# Patient Record
Sex: Female | Born: 1980 | Race: Asian | Hispanic: No | Marital: Married | Smoking: Never smoker
Health system: Southern US, Community
[De-identification: ages and names within clinical notes are randomized; demographics above are authoritative.]

## PROBLEM LIST (undated history)

## (undated) DIAGNOSIS — Z789 Other specified health status: Secondary | ICD-10-CM

## (undated) HISTORY — PX: NO PAST SURGERIES: SHX2092

---

## 2011-04-15 LAB — GC/CHLAMYDIA PROBE AMP, GENITAL
Chlamydia: NEGATIVE
Gonorrhea: NEGATIVE

## 2011-04-15 LAB — ABO/RH

## 2011-04-15 LAB — HIV ANTIBODY (ROUTINE TESTING W REFLEX): HIV: NONREACTIVE

## 2011-04-21 ENCOUNTER — Other Ambulatory Visit (HOSPITAL_COMMUNITY): Payer: Self-pay | Admitting: Obstetrics and Gynecology

## 2011-04-21 ENCOUNTER — Ambulatory Visit (HOSPITAL_COMMUNITY)
Admission: RE | Admit: 2011-04-21 | Discharge: 2011-04-21 | Disposition: A | Discharge status: Home / Self Care | Payer: BC Managed Care – PPO | Source: Ambulatory Visit | Attending: Obstetrics and Gynecology | Admitting: Obstetrics and Gynecology

## 2011-04-21 DIAGNOSIS — O368190 Decreased fetal movements, unspecified trimester, not applicable or unspecified: Secondary | ICD-10-CM

## 2011-04-21 DIAGNOSIS — Z3689 Encounter for other specified antenatal screening: Secondary | ICD-10-CM | POA: Insufficient documentation

## 2011-04-25 LAB — HIV ANTIBODY (ROUTINE TESTING W REFLEX): HIV: NONREACTIVE

## 2011-04-25 LAB — ANTIBODY SCREEN: Antibody Screen: NEGATIVE

## 2011-07-14 NOTE — L&D Delivery Note (Signed)
Pt was 8cm this am when pitocin was to be started. She was having variable and occ. Late decels.  AROM was performed at 10cm. Moderate meconium was noted. She pushed for approximately 20 min. The VE was placed at +3 station to shorten the second stage. She delivered one live viable female, apgars 8 &9. NICU present. Baby to NBN. Placenta to path. Second degree tear closed with 3-0 chromic. Baby delivered ROA.

## 2011-07-15 LAB — STREP B DNA PROBE: GBS: NEGATIVE

## 2011-07-24 ENCOUNTER — Encounter (HOSPITAL_COMMUNITY): Payer: Self-pay | Admitting: *Deleted

## 2011-07-24 ENCOUNTER — Inpatient Hospital Stay (HOSPITAL_COMMUNITY)
Admission: AD | Admit: 2011-07-24 | Discharge: 2011-07-27 | DRG: 373 | Disposition: A | Discharge status: Home / Self Care | Payer: BC Managed Care – PPO | Source: Ambulatory Visit | Attending: Obstetrics and Gynecology | Admitting: Obstetrics and Gynecology

## 2011-07-24 DIAGNOSIS — O365990 Maternal care for other known or suspected poor fetal growth, unspecified trimester, not applicable or unspecified: Principal | ICD-10-CM | POA: Diagnosis present

## 2011-07-24 DIAGNOSIS — Z3480 Encounter for supervision of other normal pregnancy, unspecified trimester: Secondary | ICD-10-CM

## 2011-07-24 HISTORY — DX: Other specified health status: Z78.9

## 2011-07-24 LAB — CBC
Platelets: 220 10*3/uL (ref 150–400)
RDW: 14.5 % (ref 11.5–15.5)
WBC: 9.6 10*3/uL (ref 4.0–10.5)

## 2011-07-24 MED ORDER — LIDOCAINE HCL (PF) 1 % IJ SOLN
30.0000 mL | INTRAMUSCULAR | Status: DC | PRN
  Administered 2011-07-25: 30 mL via SUBCUTANEOUS
  Filled 2011-07-24: qty 30

## 2011-07-24 MED ORDER — OXYCODONE-ACETAMINOPHEN 5-325 MG PO TABS: 2.0000 | ORAL_TABLET | ORAL | Status: DC | PRN

## 2011-07-24 MED ORDER — MISOPROSTOL 25 MCG QUARTER TABLET
25.0000 ug | ORAL_TABLET | ORAL | Status: DC | PRN
  Administered 2011-07-24 – 2011-07-25 (×2): 25 ug via VAGINAL
  Filled 2011-07-24 (×2): qty 0.25

## 2011-07-24 MED ORDER — OXYTOCIN 20 UNITS IN LACTATED RINGERS INFUSION - SIMPLE
1.0000 m[IU]/min | INTRAVENOUS | Status: DC
Start: 2011-07-25 — End: 2011-07-25

## 2011-07-24 MED ORDER — LACTATED RINGERS IV SOLN
500.0000 mL | INTRAVENOUS | Status: DC | PRN
  Administered 2011-07-25: 300 mL via INTRAVENOUS

## 2011-07-24 MED ORDER — ACETAMINOPHEN 325 MG PO TABS: 650.0000 mg | ORAL_TABLET | ORAL | Status: DC | PRN

## 2011-07-24 MED ORDER — FLEET ENEMA 7-19 GM/118ML RE ENEM: 1.0000 | ENEMA | RECTAL | Status: DC | PRN

## 2011-07-24 MED ORDER — CITRIC ACID-SODIUM CITRATE 334-500 MG/5ML PO SOLN: 30.0000 mL | ORAL | Status: DC | PRN

## 2011-07-24 MED ORDER — LACTATED RINGERS IV SOLN
INTRAVENOUS | Status: DC
  Administered 2011-07-24: 23:00:00 via INTRAVENOUS

## 2011-07-24 MED ORDER — ONDANSETRON HCL 4 MG/2ML IJ SOLN: 4.0000 mg | Freq: Four times a day (QID) | INTRAMUSCULAR | Status: DC | PRN

## 2011-07-24 MED ORDER — IBUPROFEN 600 MG PO TABS: 600.0000 mg | ORAL_TABLET | Freq: Four times a day (QID) | ORAL | Status: DC | PRN

## 2011-07-24 MED ORDER — TERBUTALINE SULFATE 1 MG/ML IJ SOLN: 0.2500 mg | Freq: Once | INTRAMUSCULAR | Status: AC | PRN

## 2011-07-24 MED ORDER — OXYTOCIN 20 UNITS IN LACTATED RINGERS INFUSION - SIMPLE
125.0000 mL/h | Freq: Once | INTRAVENOUS | Status: AC
  Administered 2011-07-25: 999 mL/h via INTRAVENOUS

## 2011-07-24 MED ORDER — OXYTOCIN BOLUS FROM INFUSION
500.0000 mL | Freq: Once | INTRAVENOUS | Status: DC
  Filled 2011-07-24: qty 500
  Filled 2011-07-24: qty 1000

## 2011-07-25 ENCOUNTER — Inpatient Hospital Stay (HOSPITAL_COMMUNITY): Payer: BC Managed Care – PPO | Admitting: Anesthesiology

## 2011-07-25 ENCOUNTER — Encounter (HOSPITAL_COMMUNITY): Payer: Self-pay | Admitting: *Deleted

## 2011-07-25 ENCOUNTER — Other Ambulatory Visit: Payer: Self-pay | Admitting: Obstetrics and Gynecology

## 2011-07-25 ENCOUNTER — Encounter (HOSPITAL_COMMUNITY): Payer: Self-pay | Admitting: Anesthesiology

## 2011-07-25 LAB — COMPREHENSIVE METABOLIC PANEL
ALT: 9 U/L (ref 0–35)
CO2: 18 mEq/L — ABNORMAL LOW (ref 19–32)
Calcium: 8.9 mg/dL (ref 8.4–10.5)
Chloride: 105 mEq/L (ref 96–112)
Creatinine, Ser: 0.59 mg/dL (ref 0.50–1.10)
GFR calc Af Amer: >90 mL/min (ref >90)
GFR calc non Af Amer: >90 mL/min (ref >90)
Glucose, Bld: 74 mg/dL (ref 70–99)
Sodium: 134 mEq/L — ABNORMAL LOW (ref 135–145)
Total Bilirubin: 0.2 mg/dL — ABNORMAL LOW (ref 0.3–1.2)

## 2011-07-25 LAB — CBC
HCT: 36.9 % (ref 36.0–46.0)
MCV: 82.6 fL (ref 78.0–100.0)
Platelets: 222 10*3/uL (ref 150–400)
RBC: 4.47 MIL/uL (ref 3.87–5.11)
WBC: 10.8 10*3/uL — ABNORMAL HIGH (ref 4.0–10.5)

## 2011-07-25 MED ORDER — HYDRALAZINE HCL 20 MG/ML IJ SOLN
10.0000 mg | Freq: Once | INTRAMUSCULAR | Status: AC
  Administered 2011-07-25: 10 mg via INTRAVENOUS
  Filled 2011-07-25: qty 1

## 2011-07-25 MED ORDER — EPHEDRINE 5 MG/ML INJ
10.0000 mg | INTRAVENOUS | Status: DC | PRN
  Filled 2011-07-25: qty 4

## 2011-07-25 MED ORDER — WITCH HAZEL-GLYCERIN EX PADS: 1.0000 "application " | MEDICATED_PAD | CUTANEOUS | Status: DC | PRN

## 2011-07-25 MED ORDER — BENZOCAINE-MENTHOL 20-0.5 % EX AERO
1.0000 "application " | INHALATION_SPRAY | CUTANEOUS | Status: DC | PRN
  Administered 2011-07-25: 1 via TOPICAL

## 2011-07-25 MED ORDER — MEASLES, MUMPS & RUBELLA VAC ~~LOC~~ INJ: 0.5000 mL | INJECTION | Freq: Once | SUBCUTANEOUS | Status: DC

## 2011-07-25 MED ORDER — OXYCODONE-ACETAMINOPHEN 5-325 MG PO TABS: 1.0000 | ORAL_TABLET | ORAL | Status: DC | PRN

## 2011-07-25 MED ORDER — DIPHENHYDRAMINE HCL 50 MG/ML IJ SOLN: 12.5000 mg | INTRAMUSCULAR | Status: DC | PRN

## 2011-07-25 MED ORDER — SIMETHICONE 80 MG PO CHEW: 80.0000 mg | CHEWABLE_TABLET | ORAL | Status: DC | PRN

## 2011-07-25 MED ORDER — ONDANSETRON HCL 4 MG PO TABS: 4.0000 mg | ORAL_TABLET | ORAL | Status: DC | PRN

## 2011-07-25 MED ORDER — DIBUCAINE 1 % RE OINT: 1.0000 "application " | TOPICAL_OINTMENT | RECTAL | Status: DC | PRN

## 2011-07-25 MED ORDER — PHENYLEPHRINE 40 MCG/ML (10ML) SYRINGE FOR IV PUSH (FOR BLOOD PRESSURE SUPPORT)
80.0000 ug | PREFILLED_SYRINGE | INTRAVENOUS | Status: DC | PRN
  Filled 2011-07-25: qty 5

## 2011-07-25 MED ORDER — FENTANYL 2.5 MCG/ML BUPIVACAINE 1/10 % EPIDURAL INFUSION (WH - ANES)
14.0000 mL/h | INTRAMUSCULAR | Status: DC
  Administered 2011-07-25: 14 mL/h via EPIDURAL
  Filled 2011-07-25: qty 60

## 2011-07-25 MED ORDER — TETANUS-DIPHTH-ACELL PERTUSSIS 5-2.5-18.5 LF-MCG/0.5 IM SUSP: 0.5000 mL | Freq: Once | INTRAMUSCULAR | Status: DC

## 2011-07-25 MED ORDER — LACTATED RINGERS IV SOLN
500.0000 mL | Freq: Once | INTRAVENOUS | Status: AC
  Administered 2011-07-25: 1000 mL via INTRAVENOUS

## 2011-07-25 MED ORDER — IBUPROFEN 600 MG PO TABS
600.0000 mg | ORAL_TABLET | Freq: Four times a day (QID) | ORAL | Status: DC
  Administered 2011-07-25 – 2011-07-27 (×7): 600 mg via ORAL
  Filled 2011-07-25 (×7): qty 1

## 2011-07-25 MED ORDER — EPHEDRINE 5 MG/ML INJ: 10.0000 mg | INTRAVENOUS | Status: DC | PRN

## 2011-07-25 MED ORDER — INFLUENZA VIRUS VACC SPLIT PF IM SUSP
0.5000 mL | INTRAMUSCULAR | Status: AC
  Filled 2011-07-25: qty 0.5

## 2011-07-25 MED ORDER — ONDANSETRON HCL 4 MG/2ML IJ SOLN: 4.0000 mg | INTRAMUSCULAR | Status: DC | PRN

## 2011-07-25 MED ORDER — BENZOCAINE-MENTHOL 20-0.5 % EX AERO
INHALATION_SPRAY | CUTANEOUS | Status: AC
  Administered 2011-07-25: 1 via TOPICAL
  Filled 2011-07-25: qty 56

## 2011-07-25 MED ORDER — ZOLPIDEM TARTRATE 5 MG PO TABS: 5.0000 mg | ORAL_TABLET | Freq: Every evening | ORAL | Status: DC | PRN

## 2011-07-25 MED ORDER — LIDOCAINE HCL 1.5 % IJ SOLN
INTRAMUSCULAR | Status: DC | PRN
  Administered 2011-07-25 (×2): 5 mL via EPIDURAL

## 2011-07-25 MED ORDER — PHENYLEPHRINE 40 MCG/ML (10ML) SYRINGE FOR IV PUSH (FOR BLOOD PRESSURE SUPPORT): 80.0000 ug | PREFILLED_SYRINGE | INTRAVENOUS | Status: DC | PRN

## 2011-07-25 NOTE — Anesthesia Procedure Notes (Signed)
Epidural Patient location during procedure: OB Start time: 07/25/2011 9:40 AM  Staffing Anesthesiologist: Brayton Caves R Performed by: anesthesiologist   Preanesthetic Checklist Completed: patient identified, site marked, surgical consent, pre-op evaluation, timeout performed, IV checked, risks and benefits discussed and monitors and equipment checked  Epidural Patient position: sitting Prep: site prepped and draped and DuraPrep Patient monitoring: continuous pulse ox and blood pressure Approach: midline Injection technique: LOR air and LOR saline  Needle:  Needle type: Tuohy  Needle gauge: 17 G Needle length: 9 cm Needle insertion depth: 5 cm cm Catheter type: closed end flexible Catheter size: 19 Gauge Catheter at skin depth: 10 cm Test dose: negative  Assessment Events: blood not aspirated, injection not painful, no injection resistance, negative IV test and no paresthesia  Additional Notes Patient identified.  Risk benefits discussed including failed block, incomplete pain control, headache, nerve damage, paralysis, blood pressure changes, nausea, vomiting, reactions to medication both toxic or allergic, and postpartum back pain.  Patient expressed understanding and wished to proceed.  All questions were answered.  Sterile technique used throughout procedure and epidural site dressed with sterile barrier dressing. No paresthesia or other complications noted.The patient did not experience any signs of intravascular injection such as tinnitus or metallic taste in mouth nor signs of intrathecal spread such as rapid motor block. Please see nursing notes for vital signs.

## 2011-07-25 NOTE — Progress Notes (Signed)
PIH labs all wnl 

## 2011-07-25 NOTE — H&P (Signed)
Pt is a 31 year old [place] women G1P0 at 37 weeks who is admitted for induction secondary to IUGR and abnormal dopplers.  Pt will be given Cytotec and then pit started in am.  AFI is normal. BPP is 8/8 PMHX: see [place] PE: B/P elevated, question if anxiety, has not been elevated during preg.        HEENT-wnl        Abd- gravid, no masses, no ctxs.        Cx- 50/1/-2 vtx  FHTs- reactive.  IMP/ stable PLAN/ admit

## 2011-07-25 NOTE — Anesthesia Preprocedure Evaluation (Signed)
Anesthesia Evaluation  Patient identified by MRN, date of birth, ID band Patient awake    Reviewed: Allergy & Precautions, H&P , NPO status , Patient's Chart, lab work & pertinent test results  Airway Mallampati: II TM Distance: >3 FB Neck ROM: full    Dental No notable dental hx.    Pulmonary neg pulmonary ROS, asthma ,  clear to auscultation  Pulmonary exam normal       Cardiovascular Exercise Tolerance: Good hypertension, neg cardio ROS regular Normal    Neuro/Psych Negative Neurological ROS  Negative Psych ROS   GI/Hepatic negative GI ROS, Neg liver ROS,   Endo/Other  Negative Endocrine ROS  Renal/GU negative Renal ROS  Genitourinary negative   Musculoskeletal   Abdominal Normal abdominal exam  (+)   Peds  Hematology negative hematology ROS (+)   Anesthesia Other Findings   Reproductive/Obstetrics (+) Pregnancy                           Anesthesia Physical Anesthesia Plan  ASA: II  Anesthesia Plan: Epidural   Post-op Pain Management:    Induction:   Airway Management Planned:   Additional Equipment:   Intra-op Plan:   Post-operative Plan:   Informed Consent: I have reviewed the patients History and Physical, chart, labs and discussed the procedure including the risks, benefits and alternatives for the proposed anesthesia with the patient or authorized representative who has indicated his/her understanding and acceptance.     Plan Discussed with: Anesthesiologist, CRNA and Surgeon  Anesthesia Plan Comments:         Anesthesia Quick Evaluation

## 2011-07-25 NOTE — Consult Note (Signed)
Called urgently to attend vaginal delivery of term growth restricted infant with report of meconium stained fluid. ARrived prior to delivery after which infant placed under radiant warmer. Infant had cried at time of birth and had active tone prior to being placed under radiant warmer.  Given vigorous tactile stimulation with drying and bulb suction to naso/oropharynx yielding clear mucus. Laryngoscopy deferred based on spontaneous cries and infant not being moribund.   Given Apgar of 8/9 at 1/5 minutes with no dysmorphic features. Infant's care assigned to RN in room and to assigned pediatrician with advice given to not allow open exposure to environment to avoid hypothermia and to limit time back in mother's room, rather transferring to Transitional Nursery after 15-20 minutes so further transition can be monitored more closely.  Kim Ligas MD [facility]

## 2011-07-25 NOTE — Anesthesia Postprocedure Evaluation (Signed)
  Anesthesia Post-op Note  Patient: Kim Bryan  Procedure(s) Performed: * No procedures listed *  Patient Location: PACU and Mother/Baby  Anesthesia Type: Epidural  Level of Consciousness: awake, alert  and oriented  Airway and Oxygen Therapy: Patient Spontanous Breathing   Post-op Assessment: Patient's Cardiovascular Status Stable and Respiratory Function Stable  Post-op Vital Signs: stable  Complications: No apparent anesthesia complications

## 2011-07-26 LAB — CBC
HCT: 31.5 % — ABNORMAL LOW (ref 36.0–46.0)
Hemoglobin: 10.5 g/dL — ABNORMAL LOW (ref 12.0–15.0)
MCHC: 33.3 g/dL (ref 30.0–36.0)

## 2011-07-26 NOTE — Progress Notes (Signed)
PPD#1 Pt without c/o. Lochia-mild Baby doing well. VSSAF Imp/doing well Plan/ routine care.

## 2011-07-27 ENCOUNTER — Encounter (HOSPITAL_COMMUNITY)
Admission: RE | Admit: 2011-07-27 | Discharge: 2011-07-27 | Disposition: A | Discharge status: Home / Self Care | Payer: BC Managed Care – PPO | Source: Ambulatory Visit | Attending: Obstetrics and Gynecology | Admitting: Obstetrics and Gynecology

## 2011-07-27 DIAGNOSIS — O923 Agalactia: Secondary | ICD-10-CM | POA: Insufficient documentation

## 2011-07-27 NOTE — Discharge Summary (Signed)
Discharge diagnoses-  #1-37 week intrauterine pregnancy delivered 4 pound 4.8 ounce female infant Apgars 8 and 9  #2-blood type A-positive  #3-induction for IUGR  Procedures-  #1-induction of labor  #2-vacuum extraction assisted delivery  #3-second-degree tear and repair  Summary-this 31 year old gravida 1 now para 1 was admitted at [redacted] weeks gestation-(EDC 08/14/11)-because of suspected IUGR-- for induction.    Induction went well and on the morning of 1/12 she underwent a vacuum extraction assisted delivery because of second stage fetal decelerations with delivery of a 4 pound 4.8 ounce female infant with Apgars of 8 and 9. A second-degree tear was repaired without difficulty. The mother's postpartum course was totally benign. Her CBC on 1/13 was 10.5/11.4 with  171,000 platelets.  On the morning of 1/14 she was ambulating well, breast feeding well, vital signs were stable and she was desirous of discharge. Accordingly she was given all of her instructions for discharge brochure and understood all instructions well. Discharge medications include vitamins-1 and as long as she is breast-feeding and she will also use Feosol capsules or the equivalent thereof one 3-4 times a week. In addition she was given a prescription for Motrin 600 mg to use every 6 hours for cramping or pain. She will return the office in followup in approximately 4 weeks time or as needed.

## 2011-07-29 ENCOUNTER — Inpatient Hospital Stay (HOSPITAL_COMMUNITY): Admission: RE | Admit: 2011-07-29 | Discharge: 2011-07-29 | Payer: BC Managed Care – PPO | Source: Ambulatory Visit

## 2011-07-29 NOTE — Progress Notes (Signed)
Adult Lactation Consultation Outpatient Visit Note  Patient Name: Kim Bryan Date of Birth: 11/10/1980 Gestational Age at Delivery: [redacted]w[redacted]d Type of Delivery: NSVD  Breastfeeding History: Frequency of Breastfeeding: ATTEMPTS ONLY Length of Feeding:  Voids: 5-6  Stools: 3+  Supplementing / Method: BOTTLE EBM/FORMULA  30 MLS EVERY 3 HOURS Pumping:  Type of Pump:SYMPHONY   Frequency:EVERY 3 HOURS  Volume:  20 MLS  LEFT BREAST   FEW MLS RIGHT BREAST  Comments: Baby 69 days old/pedi appointment tomorrow    Consultation Evaluation:  Patient here with c/o engorgement since yesterday.  Mother did not bring baby due to baby is small and not latching yet.  Another [facility] consult for feeding assessment is scheduled for Monday 08/03/11.  Both breasts engorged with the right side more severe.  Warm compresses and massage done 5 minutes prior to pumping.  Immediate good flow from left breast but slow flow from right side .  Right breast massaged throughout pumping with improved flow.  Ice pack then applied to right side.  10 mls obtained from right and 28 mls from left.  Patient instructed to ice every 2 hours for 20 min, heat, massage and pump every 2 hours.  Husband will assist with breast massage.  Initial Feeding Assessment: Pre-feed Weight: Post-feed Weight: Amount Transferred: Comments:  Additional Feeding Assessment: Pre-feed Weight: Post-feed Weight: Amount Transferred: Comments:  Additional Feeding Assessment: Pre-feed Weight: Post-feed Weight: Amount Transferred: Comments:  Total Breast milk Transferred this Visit:  Total Supplement Given:   Additional Interventions:   Follow-Up  Lactation appointment 08/03/11      Hansel Feinstein 07/29/2011, 4:46 PM

## 2011-08-03 ENCOUNTER — Ambulatory Visit (HOSPITAL_COMMUNITY)
Admit: 2011-08-03 | Discharge: 2011-08-03 | Disposition: A | Discharge status: Home / Self Care | Payer: BC Managed Care – PPO | Attending: Pediatrics | Admitting: Pediatrics

## 2011-08-03 NOTE — Progress Notes (Signed)
Adult Lactation Consultation Outpatient Visit Note  Patient Name: Kim Bryan Date of Birth: [birth year] Gestational Age at Delivery: [redacted]w[redacted]d Type of Delivery:   Breastfeeding History: Frequency of Breastfeeding: Has been mostly pumping and bottle feeding EBM plus formula Length of Feeding:  Voids:  Stools:   Supplementing / Method:Bottle Pumping:  Type of Pump:[employer]   Frequency: q 3-4 hours  Volume:    Comments:    Consultation Evaluation:  Initial Feeding Assessment: Pre-feed Weight:4 lbs 11.3 oz  2136g Post-feed Weight: Amount Transferred: Comments:  Additional Feeding Assessment: Pre-feed Weight: Post-feed Weight: Amount Transferred: Comments:  Additional Feeding Assessment: Pre-feed Weight: Post-feed Weight: Amount Transferred: Comments:  Total Breast milk Transferred this Visit:  Total Supplement Given: 55cc EBM by bottle  Additional Interventions: Mom states baby if very fussy at the breast. Reports she started making more milk in the last few days. Is able to express some for baby to taste. Baby latched but was on and off breast and fussy. Bottlefed 10 cc EBM then attempt at breast again. Baby would suck for a few minutes then off. Mom reports that this is much better that baby has been doing. Mom needed much assistance with having the baby at the breast.Mom would hold breast and Dad or myself held the baby to the breast. Baby was on and off both breasts for about 15 minutes, then became fussy. Mom was pleased with how baby had done so we bottle fed rest of EBM. Reviewed engorgement treatment and importance of pumping if baby is not emptying the breast. No questions at present,.Wants to go home and work on BF and will make another appointment if needed.  Follow-Up  Ped on Thursday To call here prn    Pamelia Hoit 08/03/2011, 2:03 PM

## 2011-08-27 ENCOUNTER — Encounter (HOSPITAL_COMMUNITY): Payer: BC Managed Care – PPO | Attending: Obstetrics and Gynecology

## 2011-08-27 DIAGNOSIS — O923 Agalactia: Secondary | ICD-10-CM | POA: Insufficient documentation

## 2011-09-03 ENCOUNTER — Other Ambulatory Visit: Payer: Self-pay | Admitting: Obstetrics and Gynecology

## 2012-09-27 IMAGING — US US FETAL BPP W/O NONSTRESS
1 series · 11 of 11 positions shown · non-contrast
Comparison: none

[Series 1: us fetal bpp w/o nonstress · non-contrast · 11 acquisitions, 11 frames shown]
[im 1/11]
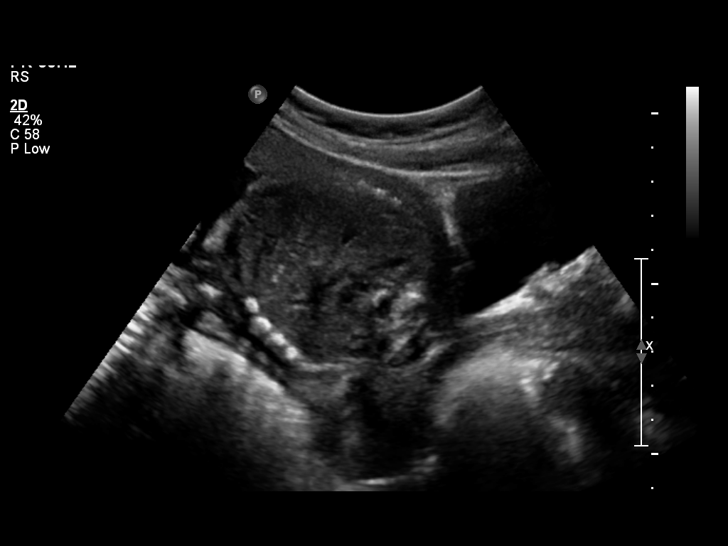
[im 2/11]
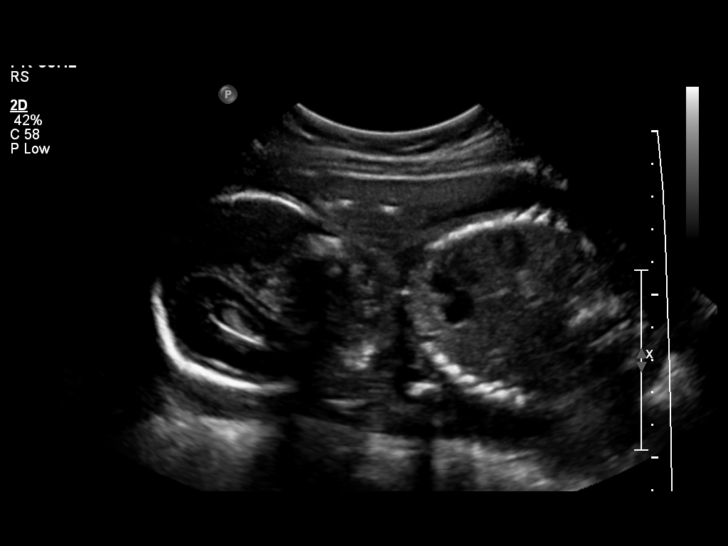
[im 3/11]
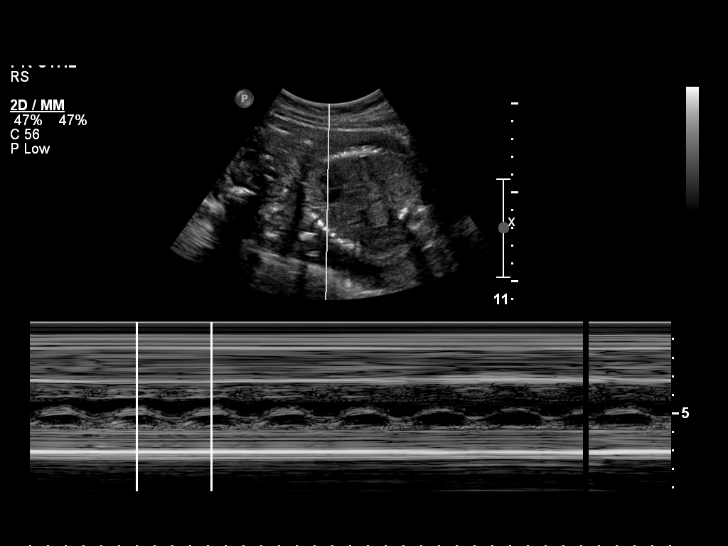
[im 4/11]
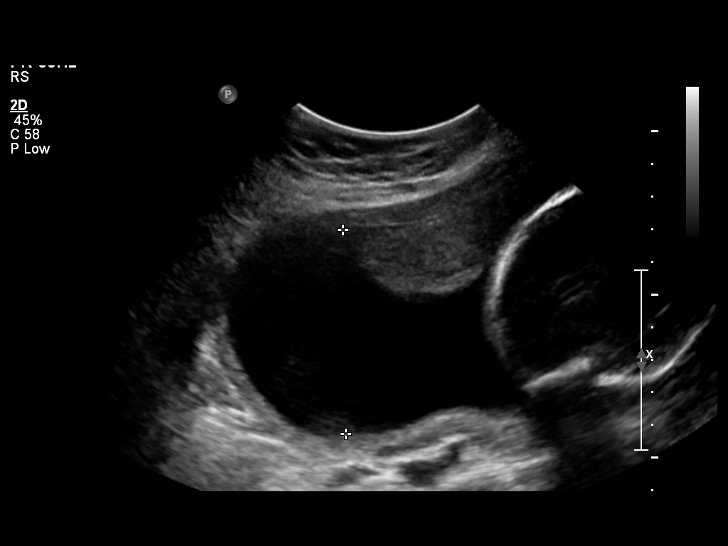
[im 5/11]
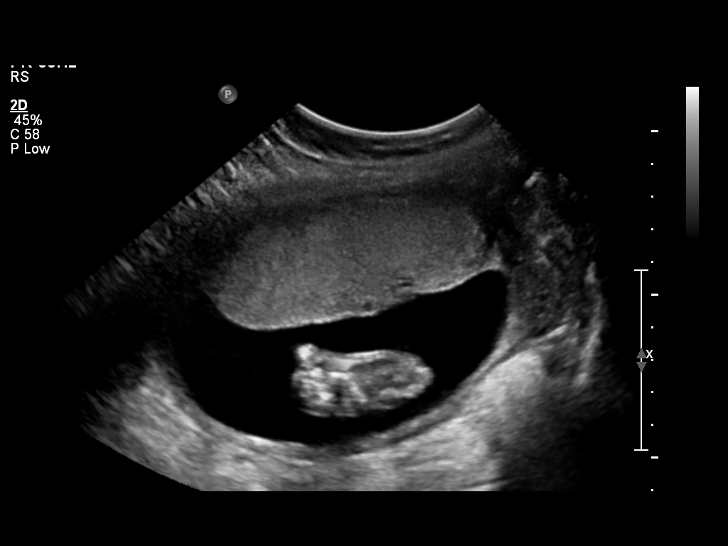
[im 6/11]
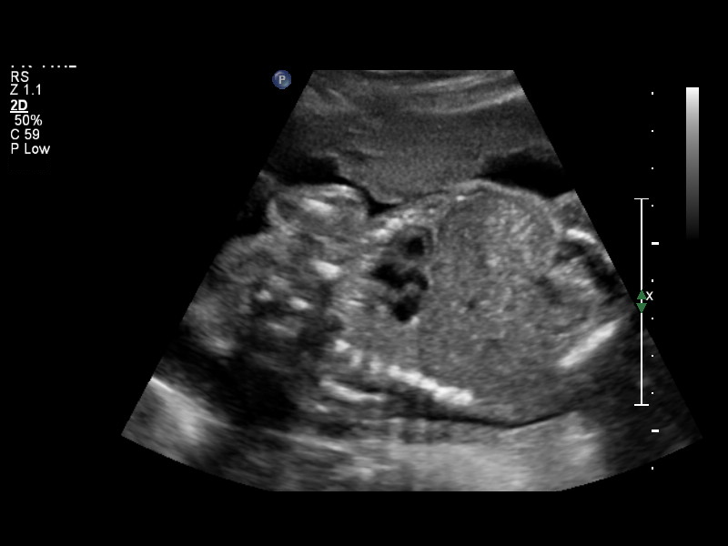
[im 7/11]
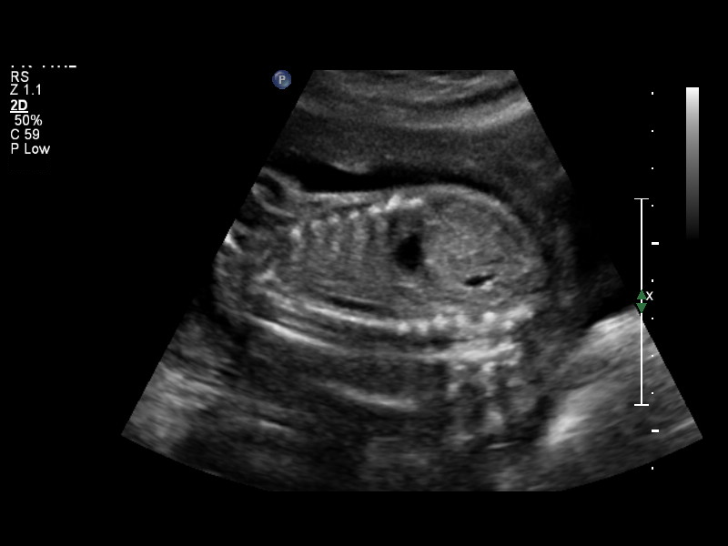
[im 8/11]
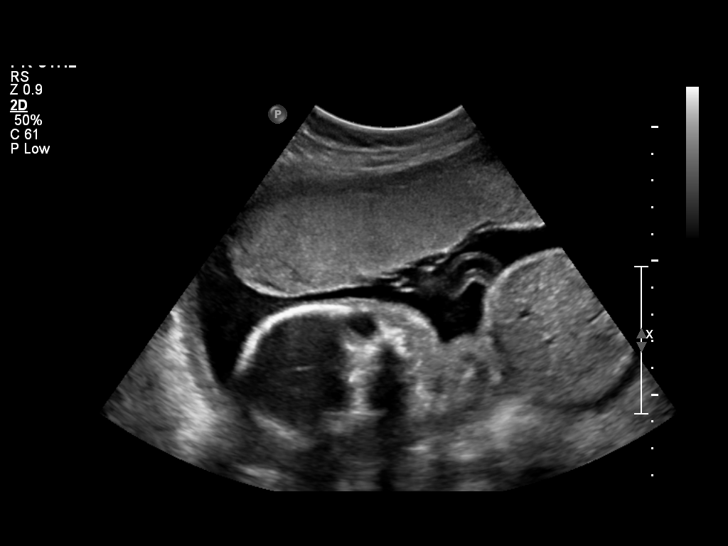
[im 9/11]
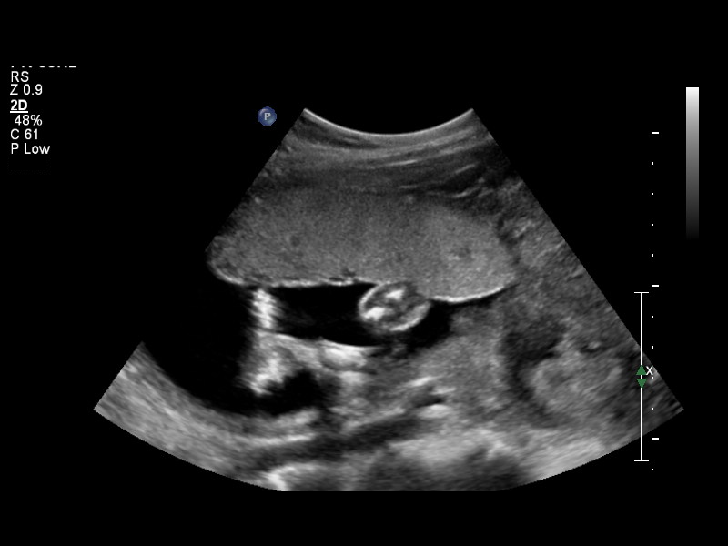
[im 10/11]
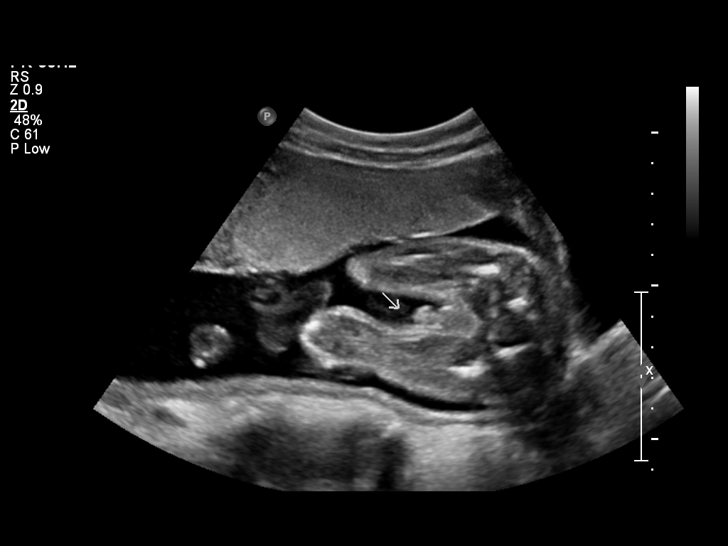
[im 11/11]
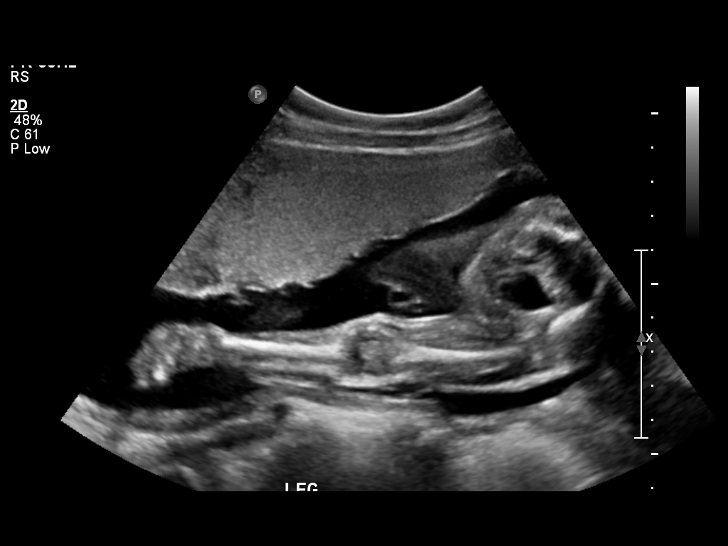

[11 of 11 positions shown; findings below may reference images not displayed]

OBSTETRICS REPORT
                      (Signed Final 04/21/2011 [DATE])

 Order#:         [id number]
Procedures

Indications

 Decreased fetal movement
Fetal Evaluation

 Fetal Heart Rate:  143                          bpm
 Cardiac Activity:  Observed
 Presentation:      Breech

 Comment:    BPP time = 24 minutes.

 Amniotic Fluid
 AFI FV:      Subjectively within normal limits
                                             Larg Pckt:     6.2  cm
Biophysical Evaluation

 Amniotic F.V:   Pocket => 2 cm two         F. Tone:        Observed
                 planes
 F. Movement:    Observed                   Score:          [DATE]
 F. Breathing:   Observed
Impression

 [DATE] BPP.

 Thank you for sharing in the care of Ms. MAIKEL GUIDRY with us.
 questions or concerns.

## 2014-04-09 LAB — OB RESULTS CONSOLE RPR: RPR: NONREACTIVE

## 2014-04-09 LAB — OB RESULTS CONSOLE ANTIBODY SCREEN: ANTIBODY SCREEN: NEGATIVE

## 2014-04-09 LAB — OB RESULTS CONSOLE HEPATITIS B SURFACE ANTIGEN: Hepatitis B Surface Ag: NEGATIVE

## 2014-04-09 LAB — OB RESULTS CONSOLE RUBELLA ANTIBODY, IGM: RUBELLA: IMMUNE

## 2014-04-09 LAB — OB RESULTS CONSOLE HIV ANTIBODY (ROUTINE TESTING): HIV: NONREACTIVE

## 2014-04-09 LAB — OB RESULTS CONSOLE ABO/RH: RH Type: POSITIVE

## 2014-04-25 LAB — OB RESULTS CONSOLE GC/CHLAMYDIA
CHLAMYDIA, DNA PROBE: NEGATIVE
Gonorrhea: NEGATIVE

## 2014-05-14 ENCOUNTER — Encounter (HOSPITAL_COMMUNITY): Payer: Self-pay | Admitting: *Deleted

## 2014-07-13 NOTE — L&D Delivery Note (Signed)
Delivery Note I was called to attend this delivery after pt presented to the unit ready to deliver.  After 4 contractions, at 5:35 AM a viable female was delivered via Vaginal, Spontaneous Delivery (Presentation: Right Occiput Anterior).  APGAR: 9,9 ; weight  .pending.  After 3 minutes, the cord was clamped and cut by FOB.  At this time, Dr. Cherly Hensenousins arrived and assumed care.  Kim Bryan,Kim Bryan   Anesthesia: None  Episiotomy: None Lacerations:   Suture Repair:  Est. Blood Loss (mL):    Mom to .  Baby to  Delivery Note At 5:35 AM a viable and healthy female was delivered via Vaginal, Spontaneous Delivery (Presentation: Right Occiput Anterior).  APGAR: 9, 9; weight 5 lb 7.8 oz (2490 g).  See above  Placenta status: Intact, Spontaneous Pathology.for IUGR  Cord: 3 vessels with the following complications: None.  Cord pH: none  Anesthesia: Local  Episiotomy: None Lacerations: 2nd degree;Perineal Suture Repair: 3.0 chromic Est. Blood Loss (mL): 64  Mom to postpartum.  Baby to Couplet care / Skin to Skin.  Kim Bryan A 11/09/2014, 6:23 AM

## 2014-10-17 LAB — OB RESULTS CONSOLE GBS: GBS: NEGATIVE

## 2014-11-09 ENCOUNTER — Inpatient Hospital Stay (HOSPITAL_COMMUNITY)
Admission: AD | Admit: 2014-11-09 | Discharge: 2014-11-11 | DRG: 775 | Disposition: A | Discharge status: Home / Self Care | Payer: BLUE CROSS/BLUE SHIELD | Source: Ambulatory Visit | Attending: Obstetrics and Gynecology | Admitting: Obstetrics and Gynecology

## 2014-11-09 ENCOUNTER — Encounter (HOSPITAL_COMMUNITY): Payer: Self-pay

## 2014-11-09 DIAGNOSIS — O365930 Maternal care for other known or suspected poor fetal growth, third trimester, not applicable or unspecified: Principal | ICD-10-CM | POA: Diagnosis present

## 2014-11-09 DIAGNOSIS — Z3A38 38 weeks gestation of pregnancy: Secondary | ICD-10-CM | POA: Diagnosis present

## 2014-11-09 DIAGNOSIS — O9952 Diseases of the respiratory system complicating childbirth: Secondary | ICD-10-CM | POA: Diagnosis present

## 2014-11-09 DIAGNOSIS — J45909 Unspecified asthma, uncomplicated: Secondary | ICD-10-CM | POA: Diagnosis present

## 2014-11-09 DIAGNOSIS — O471 False labor at or after 37 completed weeks of gestation: Secondary | ICD-10-CM | POA: Diagnosis present

## 2014-11-09 LAB — CBC
HCT: 38.5 % (ref 36.0–46.0)
Hemoglobin: 13.6 g/dL (ref 12.0–15.0)
MCH: 29.1 pg (ref 26.0–34.0)
MCHC: 35.3 g/dL (ref 30.0–36.0)
MCV: 82.3 fL (ref 78.0–100.0)
PLATELETS: 242 10*3/uL (ref 150–400)
RBC: 4.68 MIL/uL (ref 3.87–5.11)
RDW: 14.4 % (ref 11.5–15.5)
WBC: 12 10*3/uL — ABNORMAL HIGH (ref 4.0–10.5)

## 2014-11-09 LAB — TYPE AND SCREEN
ABO/RH(D): A POS
ANTIBODY SCREEN: NEGATIVE

## 2014-11-09 LAB — ABO/RH: ABO/RH(D): A POS

## 2014-11-09 LAB — RPR: RPR: NONREACTIVE

## 2014-11-09 MED ORDER — OXYCODONE-ACETAMINOPHEN 5-325 MG PO TABS
2.0000 | ORAL_TABLET | ORAL | Status: DC | PRN
Start: 2014-11-09 — End: 2014-11-09

## 2014-11-09 MED ORDER — OXYTOCIN 10 UNIT/ML IJ SOLN
INTRAMUSCULAR | Status: AC
  Filled 2014-11-09: qty 1

## 2014-11-09 MED ORDER — WITCH HAZEL-GLYCERIN EX PADS: 1.0000 "application " | MEDICATED_PAD | CUTANEOUS | Status: DC | PRN

## 2014-11-09 MED ORDER — ONDANSETRON HCL 4 MG/2ML IJ SOLN: 4.0000 mg | Freq: Four times a day (QID) | INTRAMUSCULAR | Status: DC | PRN

## 2014-11-09 MED ORDER — ONDANSETRON HCL 4 MG/2ML IJ SOLN: 4.0000 mg | INTRAMUSCULAR | Status: DC | PRN

## 2014-11-09 MED ORDER — FERROUS SULFATE 325 (65 FE) MG PO TABS
325.0000 mg | ORAL_TABLET | Freq: Two times a day (BID) | ORAL | Status: DC
  Administered 2014-11-09 – 2014-11-11 (×4): 325 mg via ORAL
  Filled 2014-11-09 (×4): qty 1

## 2014-11-09 MED ORDER — OXYCODONE-ACETAMINOPHEN 5-325 MG PO TABS: 2.0000 | ORAL_TABLET | ORAL | Status: DC | PRN

## 2014-11-09 MED ORDER — OXYTOCIN 40 UNITS IN LACTATED RINGERS INFUSION - SIMPLE MED
INTRAVENOUS | Status: AC
  Filled 2014-11-09: qty 1000

## 2014-11-09 MED ORDER — FLEET ENEMA 7-19 GM/118ML RE ENEM: 1.0000 | ENEMA | RECTAL | Status: DC | PRN

## 2014-11-09 MED ORDER — LIDOCAINE HCL (PF) 1 % IJ SOLN
INTRAMUSCULAR | Status: AC
  Administered 2014-11-09: 30 mL via SUBCUTANEOUS
  Filled 2014-11-09: qty 30

## 2014-11-09 MED ORDER — LIDOCAINE HCL (PF) 1 % IJ SOLN
30.0000 mL | INTRAMUSCULAR | Status: AC | PRN
  Administered 2014-11-09: 30 mL via SUBCUTANEOUS
  Filled 2014-11-09: qty 30

## 2014-11-09 MED ORDER — SENNOSIDES-DOCUSATE SODIUM 8.6-50 MG PO TABS
2.0000 | ORAL_TABLET | ORAL | Status: DC
  Administered 2014-11-09 – 2014-11-11 (×2): 2 via ORAL
  Filled 2014-11-09 (×2): qty 2

## 2014-11-09 MED ORDER — ZOLPIDEM TARTRATE 5 MG PO TABS: 5.0000 mg | ORAL_TABLET | Freq: Every evening | ORAL | Status: DC | PRN

## 2014-11-09 MED ORDER — ONDANSETRON HCL 4 MG PO TABS: 4.0000 mg | ORAL_TABLET | ORAL | Status: DC | PRN

## 2014-11-09 MED ORDER — BENZOCAINE-MENTHOL 20-0.5 % EX AERO
1.0000 "application " | INHALATION_SPRAY | CUTANEOUS | Status: DC | PRN
  Administered 2014-11-09: 1 via TOPICAL
  Filled 2014-11-09: qty 56

## 2014-11-09 MED ORDER — ACETAMINOPHEN 325 MG PO TABS: 650.0000 mg | ORAL_TABLET | ORAL | Status: DC | PRN

## 2014-11-09 MED ORDER — OXYTOCIN BOLUS FROM INFUSION
500.0000 mL | INTRAVENOUS | Status: DC
  Administered 2014-11-09: 500 mL via INTRAVENOUS

## 2014-11-09 MED ORDER — LANOLIN HYDROUS EX OINT: TOPICAL_OINTMENT | CUTANEOUS | Status: DC | PRN

## 2014-11-09 MED ORDER — LACTATED RINGERS IV SOLN: 500.0000 mL | INTRAVENOUS | Status: DC | PRN

## 2014-11-09 MED ORDER — LACTATED RINGERS IV SOLN
INTRAVENOUS | Status: DC
Start: 2014-11-09 — End: 2014-11-09

## 2014-11-09 MED ORDER — CITRIC ACID-SODIUM CITRATE 334-500 MG/5ML PO SOLN: 30.0000 mL | ORAL | Status: DC | PRN

## 2014-11-09 MED ORDER — PRENATAL MULTIVITAMIN CH
1.0000 | ORAL_TABLET | Freq: Every day | ORAL | Status: DC
  Administered 2014-11-09 – 2014-11-10 (×2): 1 via ORAL
  Filled 2014-11-09 (×2): qty 1

## 2014-11-09 MED ORDER — DIBUCAINE 1 % RE OINT: 1.0000 "application " | TOPICAL_OINTMENT | RECTAL | Status: DC | PRN

## 2014-11-09 MED ORDER — OXYCODONE-ACETAMINOPHEN 5-325 MG PO TABS
1.0000 | ORAL_TABLET | ORAL | Status: DC | PRN
  Administered 2014-11-09: 1 via ORAL
  Filled 2014-11-09: qty 1

## 2014-11-09 MED ORDER — OXYCODONE-ACETAMINOPHEN 5-325 MG PO TABS: 1.0000 | ORAL_TABLET | ORAL | Status: DC | PRN

## 2014-11-09 MED ORDER — SIMETHICONE 80 MG PO CHEW: 80.0000 mg | CHEWABLE_TABLET | ORAL | Status: DC | PRN

## 2014-11-09 MED ORDER — IBUPROFEN 600 MG PO TABS
600.0000 mg | ORAL_TABLET | Freq: Four times a day (QID) | ORAL | Status: DC
  Administered 2014-11-09 – 2014-11-11 (×9): 600 mg via ORAL
  Filled 2014-11-09 (×9): qty 1

## 2014-11-09 MED ORDER — OXYTOCIN 40 UNITS IN LACTATED RINGERS INFUSION - SIMPLE MED: 62.5000 mL/h | INTRAVENOUS | Status: DC

## 2014-11-09 MED ORDER — DIPHENHYDRAMINE HCL 25 MG PO CAPS: 25.0000 mg | ORAL_CAPSULE | Freq: Four times a day (QID) | ORAL | Status: DC | PRN

## 2014-11-09 NOTE — H&P (Signed)
Loretha Brasilinaz Wik is a 34 y.o. female presenting in active labor and subsequently delivered on arrival to L&D. PNC notable for last sono 4/21 showed AC( 1%) EFW ( 15%). Hx IOL @ 36 weeks for IUGR ( 2013) History OB History    Gravida Para Term Preterm AB TAB SAB Ectopic Multiple Living   3 1 1  1 1    1      Past Medical History  Diagnosis Date  . No pertinent past medical history   . Asthma no meds; sob in 2005  . Postpartum care following vaginal delivery (4/29) 11/09/2014   Past Surgical History  Procedure Laterality Date  . No past surgeries     Family History: family history is not on file. Social History:  reports that she has never smoked. She has never used smokeless tobacco. She reports that she does not drink alcohol or use illicit drugs.   Prenatal Transfer Tool  Maternal Diabetes: No Genetic Screening: Normal Maternal Ultrasounds/Referrals: Abnormal:  Findings:   IUGR Fetal Ultrasounds or other Referrals:  None Maternal Substance Abuse:  No Significant Maternal Medications:  None Significant Maternal Lab Results:  Lab values include: Group B Strep negative Other Comments:  Prior hx IUGR  ROS neg  Dilation: 8 Effacement (%): 100 Station: 0 Exam by:: Judeth HornErin Lawrence RNC unknown if currently breastfeeding. Exam Physical Exam  Constitutional: She is oriented to person, place, and time. She appears well-developed and well-nourished.  HENT:  Head: Atraumatic.  Eyes: EOM are normal.  Neck: Neck supple.  Cardiovascular: Regular rhythm.   Respiratory: Breath sounds normal.  GI: Soft.  Neurological: She is alert and oriented to person, place, and time.  Skin: Skin is warm and dry.  Psychiatric: She has a normal mood and affect.    Prenatal labs: ABO, Rh: A/Positive/-- (09/28 0000) Antibody: Negative (09/28 0000) Rubella: Immune (09/28 0000) RPR: Nonreactive (09/28 0000)  HBsAg: Negative (09/28 0000)  HIV: Non-reactive (09/28 0000)  GBS: Negative (04/06 0000)    Assessment/Plan: Active labor now delivered IUGR/SGA P) admit routine labs routine pp care   Calen Posch A 11/09/2014, 6:02 AM

## 2014-11-09 NOTE — MAU Note (Signed)
Pt here with c/o contractions since about 0030. Denies any leaking of fluid or bleeding. Was closed in the office yesterday

## 2014-11-09 NOTE — Lactation Note (Signed)
This note was copied from the chart of Kim Loretha Brasilinaz Bise. Lactation Consultation Note; Dad changing diaper as I went in. Assisted mom with latch. She is easily able to hand express Colostrum. Baby nursed for about 5 min then off to sleep. Reviewed normal newborn behavior the first 24 hours. Encouraged to watch for feeding cues and feed whenever she sees them. Encouraged lots of skin to skin today. Mom reports this baby is doing so much better than her first baby who did not latch until 773 month old- just as she was going back to work. Nursed him for 10 mons. BF brochure given with resources for support after DC. Asking how often to feed baby- whenever showing feeding cues or offer breast every 3 hours. To call for assist prn  Patient Name: Kim Bryan ZOXWR'UToday's Date: 11/09/2014 Reason for consult: Initial assessment   Maternal Data Formula Feeding for Exclusion: No Has patient been taught Hand Expression?: Yes Does the patient have breastfeeding experience prior to this delivery?: Yes  Feeding Feeding Type: Breast Fed Length of feed: 5 min  LATCH Score/Interventions Latch: Grasps breast easily, tongue down, lips flanged, rhythmical sucking.  Audible Swallowing: A few with stimulation  Type of Nipple: Everted at rest and after stimulation  Comfort (Breast/Nipple): Soft / non-tender     Hold (Positioning): Assistance needed to correctly position infant at breast and maintain latch.  LATCH Score: 8  Lactation Tools Discussed/Used     Consult Status Consult Status: Follow-up Date: 11/10/14 Follow-up type: In-patient    Pamelia HoitWeeks, Maylene Crocker D 11/09/2014, 11:27 AM

## 2014-11-10 LAB — CBC
HCT: 29.5 % — ABNORMAL LOW (ref 36.0–46.0)
HEMOGLOBIN: 9.7 g/dL — AB (ref 12.0–15.0)
MCH: 27.6 pg (ref 26.0–34.0)
MCHC: 33.2 g/dL (ref 30.0–36.0)
MCV: 83.1 fL (ref 78.0–100.0)
Platelets: 187 10*3/uL (ref 150–400)
RBC: 3.55 MIL/uL — AB (ref 3.87–5.11)
RDW: 14.5 % (ref 11.5–15.5)
WBC: 11.7 10*3/uL — ABNORMAL HIGH (ref 4.0–10.5)

## 2014-11-10 NOTE — Lactation Note (Signed)
This note was copied from the chart of [name]. Lactation Consultation Note Experienced BF mom asked if I could assist in giving suggestions on positioning and latch. Stated she wasn't sure if she had any milk or not. Hand expression of easy flow of colostrum. Mom pleased. Encouraged mom to feel breast before and after BF to see difference in breast to help her visualize that the baby got something. Encouraged occasional breast massage during BF to express more into baby during feedings.  Stressed importance of feedings d/T low birth weight. Has had 3 stools and 2 voids at 32 hours old.  Encouraged chin tug, cheek to breast, and good body alignment. Patient Name: Girl Kim Brasilinaz Featherly EAVWU'JToday's Date: 11/10/2014 Reason for consult: Follow-up assessment;Infant < 6lbs   Maternal Data    Feeding Feeding Type: Breast Fed Length of feed: 5 min  LATCH Score/Interventions Latch: Grasps breast easily, tongue down, lips flanged, rhythmical sucking.  Audible Swallowing: Spontaneous and intermittent Intervention(s): Skin to skin;Hand expression Intervention(s): Hand expression;Alternate breast massage  Type of Nipple: Everted at rest and after stimulation  Comfort (Breast/Nipple): Soft / non-tender     Hold (Positioning): Assistance needed to correctly position infant at breast and maintain latch. Intervention(s): Skin to skin;Position options;Support Pillows;Breastfeeding basics reviewed  LATCH Score: 9  Lactation Tools Discussed/Used     Consult Status Consult Status: Follow-up Date: 11/11/14 Follow-up type: In-patient    Charyl DancerCARVER, Clara Smolen G 11/10/2014, 2:31 PM

## 2014-11-10 NOTE — Progress Notes (Signed)
Patient ID: Kim Bryan, female   DOB: 02/02/1981, 34 y.o.   MRN: [id number] PPD # 1 SVD  S:  Reports feeling well             Tolerating po/ No nausea or vomiting             Bleeding is spotting             Pain controlled with ibuprofen (OTC)             Up ad lib / ambulatory / voiding without difficulties    Newborn  Information for the patient's newborn:  [name], Girl Maigan [[id number]][[id number]]  female  breast feeding    O:  A & O x 3, in no apparent distress              VS:  Filed Vitals:   11/09/14 1320 11/09/14 1749 11/10/14 0635 11/10/14 1742  BP: 114/67 108/67 106/63 98/76  Pulse: 92 86 85 78  Temp: 98.2 F (36.8 C) 98 F (36.7 C) 98 F (36.7 C) 98.6 F (37 C)  TempSrc: Oral Oral Oral Oral  Resp: 18 18 18 18   Height:      Weight:      SpO2:   99%     LABS:  Recent Labs  11/09/14 0550 11/10/14 0611  WBC 12.0* 11.7*  HGB 13.6 9.7*  HCT 38.5 29.5*  PLT 242 187    Blood type: A POS (04/29 0550)  Rubella: Immune (09/28 0000)     Lungs: Clear and unlabored  Heart: regular rate and rhythm / no murmurs  Abdomen: soft, non-tender, non-distended             Fundus: firm, non-tender, U-1  Perineum: 2nd degree repair healing well  Lochia: scant  Extremities: no edema, no calf pain or tenderness, no Homans    A/P: PPD # 1  33 y.o., [id number]   Principal Problem:    Postpartum care following vaginal delivery (4/29)      Doing well - stable status  Routine post partum orders  Anticipate discharge tomorrow    Raelyn MoraAWSON, Kim Bryan, M, MSN, CNM 11/10/2014, 1:00 PM

## 2014-11-11 MED ORDER — MAGNESIUM OXIDE 400 (241.3 MG) MG PO TABS: 200.0000 mg | ORAL_TABLET | Freq: Every day | ORAL | Status: DC

## 2014-11-11 MED ORDER — IBUPROFEN 600 MG PO TABS: 600.0000 mg | ORAL_TABLET | Freq: Four times a day (QID) | ORAL | Status: DC

## 2014-11-11 MED ORDER — FERROUS SULFATE 325 (65 FE) MG PO TABS: 325.0000 mg | ORAL_TABLET | Freq: Two times a day (BID) | ORAL | Status: DC

## 2014-11-11 MED ORDER — MAGNESIUM OXIDE 400 (241.3 MG) MG PO TABS
200.0000 mg | ORAL_TABLET | Freq: Every day | ORAL | Status: DC
  Administered 2014-11-11: 200 mg via ORAL
  Filled 2014-11-11: qty 0.5

## 2014-11-11 NOTE — Discharge Summary (Signed)
Obstetric Discharge Summary Reason for Admission: onset of labor Prenatal Procedures: ultrasound Intrapartum Course: Admitted in active labor with advanced dilation (8 cm) / rapid progression to complete dilation / SVD of viable female by Joyce CopaF. Cresenzo-Dishmon, CNM for Dr. Cherly Hensenousins / delivery of placenta and 2nd degree perineal repair by Dr. Cherly Hensenousins / no immediate postpartum complications Intrapartum Procedures: spontaneous vaginal delivery Postpartum Procedures: none Complications-Operative and Postpartum: 2nd degree perineal laceration HEMOGLOBIN  Date Value Ref Range Status  11/10/2014 9.7* 12.0 - 15.0 g/dL Final    Comment:    REPEATED TO VERIFY DELTA CHECK NOTED    HCT  Date Value Ref Range Status  11/10/2014 29.5* 36.0 - 46.0 % Final    Physical Exam:  General: alert, cooperative and no distress Lochia: appropriate Uterine Fundus: firm, midline, U-2 Perineum: healing well, no significant drainage, no dehiscence, no significant erythema DVT Evaluation: No evidence of DVT seen on physical exam. Negative Homan's sign. No cords or calf tenderness. No significant calf/ankle edema.  Discharge Diagnoses: Term Pregnancy-delivered  Discharge Information: Date: 11/11/2014 Activity: pelvic rest Diet: routine Medications: PNV, Ibuprofen, Iron and Magnesium Oxide Condition: stable Instructions: refer to practice specific booklet Discharge to: home Follow-up Information    Follow up with MODY,VAISHALI R, MD. Schedule an appointment as soon as possible for a visit in 6 weeks.   Specialty:  Obstetrics and Gynecology   Why:  postpartum visit   Contact information:   [address] [place] [id number] [phone]       Newborn Data: Live born female on 11/09/2014 Birth Weight: 5 lb 7.8 oz (2490 g) APGAR: 9, 9  Home with mother.  Raelyn MoraAWSON, Kairi Harshbarger, M MSN, CNM 11/11/2014, 6:40 AM

## 2014-11-11 NOTE — Progress Notes (Signed)
Patient ID: Kim Bryan, female   DOB: 05/26/1981, 34 y.o.   MRN: [id number] Post Partum Day #2  S/P SVD   Information for the patient's newborn:  [name], Girl Lacara [[id number]][[id number]]  female  Feeding: breast  Subjective: No HA, SOB, CP, F/C, breast symptoms. Pain well-controlled with ibuprofen. Normal vaginal bleeding, no clots.      Objective:  Temp:  [98 F (36.7 C)-98.6 F (37 C)] 98.3 F (36.8 C) (05/01 0617) Pulse Rate:  [78-85] 82 (05/01 0617) Resp:  [18-19] 19 (05/01 0617) BP: (98-116)/(63-77) 116/77 mmHg (05/01 0617) SpO2:  [99 %-100 %] 100 % (05/01 0617)    Recent Labs  11/09/14 0550 11/10/14 0611  WBC 12.0* 11.7*  HGB 13.6 9.7*  HCT 38.5 29.5*  PLT 242 187    Blood type: A POS (04/29 0550) Rubella: Immune (09/28 0000)    Physical Exam:  General: alert, cooperative and no distress Uterine Fundus: firm, midline, U-2 Lochia: appropriate Perineum: 2nd degree repair healing well, edema none DVT Evaluation: No evidence of DVT seen on physical exam. Negative Homan's sign. No cords or calf tenderness. No significant calf/ankle edema.   Assessment/Plan: PPD # 2 / 34 y.o., [id number] S/P: spontaneous vaginal Principal Problem: Postpartum care following vaginal delivery (4/29)  Normal postpartum exam Continue current postpartum care D/C home   LOS: 2 days   Kim Bryan, Kim Bryan, M, MSN, CNM 11/11/2014, 6:24 AM

## 2014-11-11 NOTE — Lactation Note (Signed)
This note was copied from the chart of Girl Loretha Brasilinaz Lohse. Lactation Consultation Note: Observed mother independently latch infant. Infant has good deep latch with observed audible swallows. Mother taught breast compression. Infant sustained latch for 15 mins. Mother alternate breast and latched infant on . Observed that mother's breast are filling. Mother plans to supplement infant with additional formula. Reviewed collection and storage of expressed breastmilk. Mother very receptive to all teaching. Advised to continue to post pump with DEBP. Mother states she has pump at home. Reviewed treatment to prevent severe engorgment.  Patient Name: Girl Loretha Brasilinaz Leighty JYNWG'NToday's Date: 11/11/2014 Reason for consult: Follow-up assessment   Maternal Data    Feeding Feeding Type: Breast Fed Length of feed: 15 min  LATCH Score/Interventions Latch: Grasps breast easily, tongue down, lips flanged, rhythmical sucking.  Audible Swallowing: Spontaneous and intermittent Intervention(s): Hand expression Intervention(s): Skin to skin  Type of Nipple: Everted at rest and after stimulation  Comfort (Breast/Nipple): Filling, red/small blisters or bruises, mild/mod discomfort  Problem noted: Filling  Hold (Positioning): Assistance needed to correctly position infant at breast and maintain latch. Intervention(s): Support Pillows;Position options;Skin to skin  LATCH Score: 8  Lactation Tools Discussed/Used     Consult Status Consult Status: Follow-up Date: 11/11/14 Follow-up type: In-patient    Stevan BornKendrick, [facility] Kue[facility] 11/11/2014, 11:32 AM

## 2014-11-11 NOTE — Discharge Instructions (Signed)
Breast Pumping Tips °If you are breastfeeding, there may be times when you cannot feed your baby directly. Returning to work or going on a trip are common examples. Pumping allows you to store breast milk and feed it to your baby later.  °You may not get much milk when you first start to pump. Your breasts should start to make more after a few days. If you pump at the times you usually feed your baby, you may be able to keep making enough milk to feed your baby without also using formula. The more often you pump, the more milk you will produce.  °WHEN SHOULD I PUMP?  °· You can begin to pump soon after delivery. However, some experts recommend waiting about 4 weeks before giving your infant a bottle to make sure breastfeeding is going well.  °· If you plan to return to work, begin pumping a few weeks before. This will help you develop techniques that work best for you. It also lets you build up a supply of breast milk.   °· When you are with your infant, feed on demand and pump after each feeding.   °· When you are away from your infant for several hours, pump for about 15 minutes every 2-3 hours. Pump both breasts at the same time if you can.   °· If your infant has a formula feeding, make sure to pump around the same time.     °· If you drink any alcohol, wait 2 hours before pumping.   °HOW DO I PREPARE TO PUMP? °Your let-down reflex is the natural reaction to stimulation that makes your breast milk flow. It is easier to stimulate this reflex when you are relaxed. Find relaxation techniques that work for you. If you have difficulty with your let-down reflex, try these methods:  °· Smell one of your infant's blankets or an item of clothing.   °· Look at a picture or video of your infant.   °· Sit in a quiet, private space.   °· Massage the breast you plan to pump.   °· Place soothing warmth on the breast.   °· Play relaxing music.   °WHAT ARE SOME GENERAL BREAST PUMPING TIPS? °· Wash your hands before you pump. You  do not need to wash your nipples or breasts. °· There are three ways to pump. °¨ You can use your hand to massage and compress your breast. °¨ You can use a handheld manual pump. °¨ You can use an electric pump.   °· Make sure the suction cup (flange) on the breast pump is the right size. Place the flange directly over the nipple. If it is the wrong size or placed the wrong way, it may be painful and cause nipple damage.   °· If pumping is uncomfortable, apply a small amount of purified or modified lanolin to your nipple and areola. °· If you are using an electric pump, adjust the speed and suction power to be more comfortable. °· If pumping is painful or if you are not getting very much milk, you may need a different type of pump. A lactation consultant can help you determine what type of pump to use.   °· Keep a full water bottle near you at all times. Drinking lots of fluid helps you make more milk.  °· You can store your milk to use later. Pumped breast milk can be stored in a sealable, sterile container or plastic bag. Label all stored breast milk with the date you pumped it. °¨ Milk can stay out at room temperature for up to 8 hours. °¨   You can store your milk in the refrigerator for up to 8 days. °¨ You can store your milk in the freezer for 3 months. Thaw frozen milk using warm water. Do not put it in the microwave. °· Do not smoke. Smoking can lower your milk supply and harm your infant. If you need help quitting, ask your health care provider to recommend a program.   °WHEN SHOULD I CALL MY HEALTH CARE PROVIDER OR A LACTATION CONSULTANT? °· You are having trouble pumping. °· You are concerned that you are not making enough milk. °· You have nipple pain, soreness, or redness. °· You want to use birth control. Birth control pills may lower your milk supply. Talk to your health care provider about your options. °Document Released: 12/17/2009 Document Revised: 07/04/2013 Document Reviewed:  04/21/2013 °Exit[employer]® Patient Information ©2015 [employer]. This information is not intended to replace advice given to you by your health care provider. Make sure you discuss any questions you have with your health care provider. ° °Nutrition for the New Mother  °A new mother needs good health and nutrition so she can have energy to take care of a new baby. Whether a mother breastfeeds or formula feeds the baby, it is important to have a well-balanced diet. Foods from all the food groups should be chosen to meet the new mother's energy needs and to give her the nutrients needed for repair and healing.  °A HEALTHY EATING PLAN °The My [employer] plan for Moms outlines what you should eat to help you and your baby stay healthy. The energy and amount of food you need depends on whether or not you are breastfeeding. If you are breastfeeding you will need more nutrients. If you choose not to breastfeed, your nutrition goal should be to return to a healthy weight. Limiting calories may be needed if you are not breastfeeding.  °HOME CARE INSTRUCTIONS  °· For a personal plan based on your unique needs, see your Registered Dietitian or visit www.mypyramid.gov. °· Eat a variety of foods. The plan below will help guide you. The following chart has a suggested daily meal plan from the [employer] for Moms. °· Eat a variety of fruits and vegetables. °· Eat more dark green and orange vegetables and cooked dried beans. °· Make half your grains whole grains. Choose whole instead of refined grains. °· Choose low-fat or lean meats and poultry. °· Choose low-fat or fat-free dairy products like milk, cheese, or yogurt. °Fruits °· Breastfeeding: 2 cups °· Non-Breastfeeding: 2 cups °· What Counts as a serving? °¨ 1 cup of fruit or juice. °¨ ½ cup dried fruit. °Vegetables °· Breastfeeding: 3 cups °· Non-Breastfeeding: 2 ½ cups °· What Counts as a serving? °¨ 1 cup raw or cooked vegetables. °¨ Juice or 2 cups raw leafy  vegetables. °Grains °· Breastfeeding: 8 oz °· Non-Breastfeeding: 6 oz °· What Counts as a serving? °¨ 1 slice bread. °¨ 1 oz ready-to-eat cereal. °¨ ½ cup cooked pasta, rice, or cereal. °Meat and Beans °· Breastfeeding: 6 ½ oz °· Non-Breastfeeding: 5 ½ oz °· What Counts as a serving? °¨ 1 oz lean meat, poultry, or fish °¨ ¼ cup cooked dry beans °¨ ½ oz nuts or 1 egg °¨ 1 tbs peanut butter °Milk °· Breastfeeding: 3 cups °· Non-Breastfeeding: 3 cups °· What Counts as a serving? °¨ 1 cup milk. °¨ 8 oz yogurt. °¨ 1 ½ oz cheese. °¨ 2 oz processed cheese. °TIPS FOR THE BREASTFEEDING MOM °· Rapid weight   loss is not suggested when you are breastfeeding. By simply breastfeeding, you will be able to lose the weight gained during your pregnancy. Your caregiver can keep track of your weight and tell you if your weight loss is appropriate. °· Be sure to drink fluids. You may notice that you are thirstier than usual. A suggestion is to drink a glass of water or other beverage whenever you breastfeed. °· Avoid alcohol as it can be passed into your breast milk. °· Limit caffeine drinks to no more than 2 to 3 cups per day. °· You may need to keep taking your prenatal vitamin while you are breastfeeding. Talk with your caregiver about taking a vitamin or supplement. °RETURING TO A HEALTHY WEIGHT °· The My [employer] Plan for Moms will help you return to a healthy weight. It will also provide the nutrients you need. °· You may need to limit "empty" calories. These include: °¨ High fat foods like fried foods, fatty meats, fast food, butter, and mayonnaise. °¨ High sugar foods like sodas, jelly, candy, and sweets. °· Be physically active. Include 30 minutes of exercise or more each day. Choose an activity you like such as walking, swimming, biking, or aerobics. Check with your caregiver before you start to exercise. °Document Released: 10/06/2007 Document Revised: 09/21/2011 Document Reviewed: 10/06/2007 °ExitCare® Patient Information  ©2015 [employer]. This information is not intended to replace advice given to you by your health care provider. Make sure you discuss any questions you have with your health care provider. °Postpartum Depression and Baby Blues °The postpartum period begins right after the birth of a baby. During this time, there is often a great amount of joy and excitement. It is also a time of many changes in the life of the parents. Regardless of how many times a mother gives birth, each child brings new challenges and dynamics to the family. It is not unusual to have feelings of excitement along with confusing shifts in moods, emotions, and thoughts. All mothers are at risk of developing postpartum depression or the "baby blues." These mood changes can occur right after giving birth, or they may occur many months after giving birth. The baby blues or postpartum depression can be mild or severe. Additionally, postpartum depression can go away rather quickly, or it can be a long-term condition.  °CAUSES °Raised hormone levels and the rapid drop in those levels are thought to be a main cause of postpartum depression and the baby blues. A number of hormones change during and after pregnancy. Estrogen and progesterone usually decrease right after the delivery of your baby. The levels of thyroid hormone and various cortisol steroids also rapidly drop. Other factors that play a role in these mood changes include major life events and genetics.  °RISK FACTORS °If you have any of the following risks for the baby blues or postpartum depression, know what symptoms to watch out for during the postpartum period. Risk factors that may increase the likelihood of getting the baby blues or postpartum depression include: °· Having a personal or family history of depression.   °· Having depression while being pregnant.   °· Having premenstrual mood issues or mood issues related to oral contraceptives. °· Having a lot of life stress.   °· Having  marital conflict.   °· Lacking a social support network.   °· Having a baby with special needs.   °· Having health problems, such as diabetes.   °SIGNS AND SYMPTOMS °Symptoms of baby blues include: °· Brief changes in mood, such as going   from extreme happiness to sadness. °· Decreased concentration.   °· Difficulty sleeping.   °· Crying spells, tearfulness.   °· Irritability.   °· Anxiety.   °Symptoms of postpartum depression typically begin within the first month after giving birth. These symptoms include: °· Difficulty sleeping or excessive sleepiness.   °· Marked weight loss.   °· Agitation.   °· Feelings of worthlessness.   °· Lack of interest in activity or food.   °Postpartum psychosis is a very serious condition and can be dangerous. Fortunately, it is rare. Displaying any of the following symptoms is cause for immediate medical attention. Symptoms of postpartum psychosis include:  °· Hallucinations and delusions.   °· Bizarre or disorganized behavior.   °· Confusion or disorientation.   °DIAGNOSIS  °A diagnosis is made by an evaluation of your symptoms. There are no medical or lab tests that lead to a diagnosis, but there are various questionnaires that a health care provider may use to identify those with the baby blues, postpartum depression, or psychosis. Often, a screening tool called the Edinburgh Postnatal Depression Scale is used to diagnose depression in the postpartum period.  °TREATMENT °The baby blues usually goes away on its own in 1-2 weeks. Social support is often all that is needed. You will be encouraged to get adequate sleep and rest. Occasionally, you may be given medicines to help you sleep.  °Postpartum depression requires treatment because it can last several months or longer if it is not treated. Treatment may include individual or group therapy, medicine, or both to address any social, physiological, and psychological factors that may play a role in the depression. Regular exercise, a  healthy diet, rest, and social support may also be strongly recommended.  °Postpartum psychosis is more serious and needs treatment right away. Hospitalization is often needed. °HOME CARE INSTRUCTIONS °· Get as much rest as you can. Nap when the baby sleeps.   °· Exercise regularly. Some women find yoga and walking to be beneficial.   °· Eat a balanced and nourishing diet.   °· Do little things that you enjoy. Have a cup of tea, take a bubble bath, read your favorite magazine, or listen to your favorite music. °· Avoid alcohol.   °· Ask for help with household chores, cooking, grocery shopping, or running errands as needed. Do not try to do everything.   °· Talk to people close to you about how you are feeling. Get support from your partner, family members, friends, or other new moms. °· Try to stay positive in how you think. Think about the things you are grateful for.   °· Do not spend a lot of time alone.   °· Only take over-the-counter or prescription medicine as directed by your health care provider. °· Keep all your postpartum appointments.   °· Let your health care provider know if you have any concerns.   °SEEK MEDICAL CARE IF: °You are having a reaction to or problems with your medicine. °SEEK IMMEDIATE MEDICAL CARE IF: °· You have suicidal feelings.   °· You think you may harm the baby or someone else. °MAKE SURE YOU: °· Understand these instructions. °· Will watch your condition. °· Will get help right away if you are not doing well or get worse. °Document Released: 04/02/2004 Document Revised: 07/04/2013 Document Reviewed: 04/10/2013 °Exit[employer]® Patient Information ©2015 [employer]. This information is not intended to replace advice given to you by your health care provider. Make sure you discuss any questions you have with your health care provider. °Breastfeeding and Mastitis °Mastitis is inflammation of the breast tissue. It can occur in women who   are breastfeeding. This can make breastfeeding  painful. Mastitis will sometimes go away on its own. Your health care provider will help determine if treatment is needed. °CAUSES °Mastitis is often associated with a blocked milk (lactiferous) duct. This can happen when too much milk builds up in the breast. Causes of excess milk in the breast can include: °· Poor latch-on. If your baby is not latched onto the breast properly, she or he may not empty your breast completely while breastfeeding. °· Allowing too much time to pass between feedings. °· Wearing a bra or other clothing that is too tight. This puts extra pressure on the lactiferous ducts so milk does not flow through them as it should. °Mastitis can also be caused by a bacterial infection. Bacteria may enter the breast tissue through cuts or openings in the skin. In women who are breastfeeding, this may occur because of cracked or irritated skin. Cracks in the skin are often caused when your baby does not latch on properly to the breast. °SIGNS AND SYMPTOMS °· Swelling, redness, tenderness, and pain in an area of the breast. °· Swelling of the glands under the arm on the same side. °· Fever may or may not accompany mastitis. °If an infection is allowed to progress, a collection of pus (abscess) may develop. °DIAGNOSIS  °Your health care provider can usually diagnose mastitis based on your symptoms and a physical exam. Tests may be done to help confirm the diagnosis. These may include: °· Removal of pus from the breast by applying pressure to the area. This pus can be examined in the lab to determine which bacteria are present. If an abscess has developed, the fluid in the abscess can be removed with a needle. This can also be used to confirm the diagnosis and determine the bacteria present. In most cases, pus will not be present. °· Blood tests to determine if your body is fighting a bacterial infection. °· Mammogram or ultrasound tests to rule out other problems or diseases. °TREATMENT  °Mastitis that  occurs with breastfeeding will sometimes go away on its own. Your health care provider may choose to wait 24 hours after first seeing you to decide whether a prescription medicine is needed. If your symptoms are worse after 24 hours, your health care provider will likely prescribe an antibiotic medicine to treat the mastitis. He or she will determine which bacteria are most likely causing the infection and will then select an appropriate antibiotic medicine. This is sometimes changed based on the results of tests performed to identify the bacteria, or if there is no response to the antibiotic medicine selected. Antibiotic medicines are usually given by mouth. You may also be given medicine for pain. °HOME CARE INSTRUCTIONS °· Only take over-the-counter or prescription medicines for pain, fever, or discomfort as directed by your health care provider. °· If your health care provider prescribed an antibiotic medicine, take the medicine as directed. Make sure you finish it even if you start to feel better. °· Do not wear a tight or underwire bra. Wear a soft, supportive bra. °· Increase your fluid intake, especially if you have a fever. °· Continue to empty the breast. Your health care provider can tell you whether this milk is safe for your infant or needs to be thrown out. You may be told to stop nursing until your health care provider thinks it is safe for your baby. Use a breast pump if you are advised to stop nursing. °· Keep your nipples   clean and dry. °· Empty the first breast completely before going to the other breast. If your baby is not emptying your breasts completely for some reason, use a breast pump to empty your breasts. °· If you go back to work, pump your breasts while at work to stay in time with your nursing schedule. °· Avoid allowing your breasts to become overly filled with milk (engorged). °SEEK MEDICAL CARE IF: °· You have pus-like discharge from the breast. °· Your symptoms do not improve with  the treatment prescribed by your health care provider within 2 days. °SEEK IMMEDIATE MEDICAL CARE IF: °· Your pain and swelling are getting worse. °· You have pain that is not controlled with medicine. °· You have a red line extending from the breast toward your armpit. °· You have a fever or persistent symptoms for more than 2-3 days. °· You have a fever and your symptoms suddenly get worse. °MAKE SURE YOU:  °· Understand these instructions. °· Will watch your condition. °· Will get help right away if you are not doing well or get worse. °Document Released: 10/24/2004 Document Revised: 07/04/2013 Document Reviewed: 02/02/2013 °[employer]® Patient Information ©2015 [employer]. This information is not intended to replace advice given to you by your health care provider. Make sure you discuss any questions you have with your health care provider. °Breastfeeding °Deciding to breastfeed is one of the best choices you can make for you and your baby. A change in hormones during pregnancy causes your breast tissue to grow and increases the number and size of your milk ducts. These hormones also allow proteins, sugars, and fats from your blood supply to make breast milk in your milk-producing glands. Hormones prevent breast milk from being released before your baby is born as well as prompt milk flow after birth. Once breastfeeding has begun, thoughts of your baby, as well as his or her sucking or crying, can stimulate the release of milk from your milk-producing glands.  °BENEFITS OF BREASTFEEDING °For Your Baby °· Your first milk (colostrum) helps your baby's digestive system function better.   °· There are antibodies in your milk that help your baby fight off infections.   °· Your baby has a lower incidence of asthma, allergies, and sudden infant death syndrome.   °· The nutrients in breast milk are better for your baby than infant formulas and are designed uniquely for your baby's needs.   °· Breast milk improves your  baby's brain development.   °· Your baby is less likely to develop other conditions, such as childhood obesity, asthma, or type 2 diabetes mellitus.   °For You  °· Breastfeeding helps to create a very special bond between you and your baby.   °· Breastfeeding is convenient. Breast milk is always available at the correct temperature and costs nothing.   °· Breastfeeding helps to burn calories and helps you lose the weight gained during pregnancy.   °· Breastfeeding makes your uterus contract to its prepregnancy size faster and slows bleeding (lochia) after you give birth.   °· Breastfeeding helps to lower your risk of developing type 2 diabetes mellitus, osteoporosis, and breast or ovarian cancer later in life. °SIGNS THAT YOUR BABY IS HUNGRY °Early Signs of Hunger  °· Increased alertness or activity. °· Stretching. °· Movement of the head from side to side. °· Movement of the head and opening of the mouth when the corner of the mouth or cheek is stroked (rooting). °· Increased sucking sounds, smacking lips, cooing, sighing, or squeaking. °· Hand-to-mouth movements. °· Increased sucking of   fingers or hands. °Late Signs of Hunger °· Fussing. °· Intermittent crying. °Extreme Signs of Hunger °Signs of extreme hunger will require calming and consoling before your baby will be able to breastfeed successfully. Do not wait for the following signs of extreme hunger to occur before you initiate breastfeeding:   °· Restlessness. °· A loud, strong cry. °·  Screaming. °BREASTFEEDING BASICS °Breastfeeding Initiation °· Find a comfortable place to sit or lie down, with your neck and back well supported. °· Place a pillow or rolled up blanket under your baby to bring him or her to the level of your breast (if you are seated). Nursing pillows are specially designed to help support your arms and your baby while you breastfeed. °· Make sure that your baby's abdomen is facing your abdomen.   °· Gently massage your breast. With your  fingertips, massage from your chest wall toward your nipple in a circular motion. This encourages milk flow. You may need to continue this action during the feeding if your milk flows slowly. °· Support your breast with 4 fingers underneath and your thumb above your nipple. Make sure your fingers are well away from your nipple and your baby's mouth.   °· Stroke your baby's lips gently with your finger or nipple.   °· When your baby's mouth is open wide enough, quickly bring your baby to your breast, placing your entire nipple and as much of the colored area around your nipple (areola) as possible into your baby's mouth.   °¨ More areola should be visible above your baby's upper lip than below the lower lip.   °¨ Your baby's tongue should be between his or her lower gum and your breast.   °· Ensure that your baby's mouth is correctly positioned around your nipple (latched). Your baby's lips should create a seal on your breast and be turned out (everted). °· It is common for your baby to suck about 2-3 minutes in order to start the flow of breast milk. °Latching °Teaching your baby how to latch on to your breast properly is very important. An improper latch can cause nipple pain and decreased milk supply for you and poor weight gain in your baby. Also, if your baby is not latched onto your nipple properly, he or she may swallow some air during feeding. This can make your baby fussy. Burping your baby when you switch breasts during the feeding can help to get rid of the air. However, teaching your baby to latch on properly is still the best way to prevent fussiness from swallowing air while breastfeeding. °Signs that your baby has successfully latched on to your nipple:    °· Silent tugging or silent sucking, without causing you pain.   °· Swallowing heard between every 3-4 sucks.   °·  Muscle movement above and in front of his or her ears while sucking.   °Signs that your baby has not successfully latched on to  nipple:  °· Sucking sounds or smacking sounds from your baby while breastfeeding. °· Nipple pain. °If you think your baby has not latched on correctly, slip your finger into the corner of your baby's mouth to break the suction and place it between your baby's gums. Attempt breastfeeding initiation again. °Signs of Successful Breastfeeding °Signs from your baby:   °· A gradual decrease in the number of sucks or complete cessation of sucking.   °· Falling asleep.   °· Relaxation of his or her body.   °· Retention of a small amount of milk in his or her mouth.   °· Letting go   of your breast by himself or herself. °Signs from you: °· Breasts that have increased in firmness, weight, and size 1-3 hours after feeding.   °· Breasts that are softer immediately after breastfeeding. °· Increased milk volume, as well as a change in milk consistency and color by the fifth day of breastfeeding.   °· Nipples that are not sore, cracked, or bleeding. °Signs That Your Baby is Getting Enough Milk °· Wetting at least 3 diapers in a 24-hour period. The urine should be clear and pale yellow by age 5 days. °· At least 3 stools in a 24-hour period by age 5 days. The stool should be soft and yellow. °· At least 3 stools in a 24-hour period by age 7 days. The stool should be seedy and yellow. °· No loss of weight greater than 10% of birth weight during the first 3 days of age. °· Average weight gain of 4-7 ounces (113-198 g) per week after age 4 days. °· Consistent daily weight gain by age 5 days, without weight loss after the age of 2 weeks. °After a feeding, your baby may spit up a small amount. This is common. °BREASTFEEDING FREQUENCY AND DURATION °Frequent feeding will help you make more milk and can prevent sore nipples and breast engorgement. Breastfeed when you feel the need to reduce the fullness of your breasts or when your baby shows signs of hunger. This is called "breastfeeding on demand." Avoid introducing a pacifier to your  baby while you are working to establish breastfeeding (the first 4-6 weeks after your baby is born). After this time you may choose to use a pacifier. Research has shown that pacifier use during the first year of a baby's life decreases the risk of sudden infant death syndrome (SIDS). °Allow your baby to feed on each breast as long as he or she wants. Breastfeed until your baby is finished feeding. When your baby unlatches or falls asleep while feeding from the first breast, offer the second breast. Because newborns are often sleepy in the first few weeks of life, you may need to awaken your baby to get him or her to feed. °Breastfeeding times will vary from baby to baby. However, the following rules can serve as a guide to help you ensure that your baby is properly fed: °· Newborns (babies 4 weeks of age or younger) may breastfeed every 1-3 hours. °· Newborns should not go longer than 3 hours during the day or 5 hours during the night without breastfeeding. °· You should breastfeed your baby a minimum of 8 times in a 24-hour period until you begin to introduce solid foods to your baby at around 6 months of age. °BREAST MILK PUMPING °Pumping and storing breast milk allows you to ensure that your baby is exclusively fed your breast milk, even at times when you are unable to breastfeed. This is especially important if you are going back to work while you are still breastfeeding or when you are not able to be present during feedings. Your lactation consultant can give you guidelines on how long it is safe to store breast milk.  °A breast pump is a machine that allows you to pump milk from your breast into a sterile bottle. The pumped breast milk can then be stored in a refrigerator or freezer. Some breast pumps are operated by hand, while others use electricity. Ask your lactation consultant which type will work best for you. Breast pumps can be purchased, but some hospitals and breastfeeding support groups   lease  breast pumps on a monthly basis. A lactation consultant can teach you how to hand express breast milk, if you prefer not to use a pump.  °CARING FOR YOUR BREASTS WHILE YOU BREASTFEED °Nipples can become dry, cracked, and sore while breastfeeding. The following recommendations can help keep your breasts moisturized and healthy: °· Avoid using soap on your nipples.   °· Wear a supportive bra. Although not required, special nursing bras and tank tops are designed to allow access to your breasts for breastfeeding without taking off your entire bra or top. Avoid wearing underwire-style bras or extremely tight bras. °· Air dry your nipples for 3-4 minutes after each feeding.   °· Use only cotton bra pads to absorb leaked breast milk. Leaking of breast milk between feedings is normal.   °· Use lanolin on your nipples after breastfeeding. Lanolin helps to maintain your skin's normal moisture barrier. If you use pure lanolin, you do not need to wash it off before feeding your baby again. Pure lanolin is not toxic to your baby. You may also hand express a few drops of breast milk and gently massage that milk into your nipples and allow the milk to air dry. °In the first few weeks after giving birth, some women experience extremely full breasts (engorgement). Engorgement can make your breasts feel heavy, warm, and tender to the touch. Engorgement peaks within 3-5 days after you give birth. The following recommendations can help ease engorgement: °· Completely empty your breasts while breastfeeding or pumping. You may want to start by applying warm, moist heat (in the shower or with warm water-soaked hand towels) just before feeding or pumping. This increases circulation and helps the milk flow. If your baby does not completely empty your breasts while breastfeeding, pump any extra milk after he or she is finished. °· Wear a snug bra (nursing or regular) or tank top for 1-2 days to signal your body to slightly decrease milk  production. °· Apply ice packs to your breasts, unless this is too uncomfortable for you. °· Make sure that your baby is latched on and positioned properly while breastfeeding. °If engorgement persists after 48 hours of following these recommendations, contact your health care provider or a lactation consultant. °OVERALL HEALTH CARE RECOMMENDATIONS WHILE BREASTFEEDING °· Eat healthy foods. Alternate between meals and snacks, eating 3 of each per day. Because what you eat affects your breast milk, some of the foods may make your baby more irritable than usual. Avoid eating these foods if you are sure that they are negatively affecting your baby. °· Drink milk, fruit juice, and water to satisfy your thirst (about 10 glasses a day).   °· Rest often, relax, and continue to take your prenatal vitamins to prevent fatigue, stress, and anemia. °· Continue breast self-awareness checks. °· Avoid chewing and smoking tobacco. °· Avoid alcohol and drug use. °Some medicines that may be harmful to your baby can pass through breast milk. It is important to ask your health care provider before taking any medicine, including all over-the-counter and prescription medicine as well as vitamin and herbal supplements. °It is possible to become pregnant while breastfeeding. If birth control is desired, ask your health care provider about options that will be safe for your baby. °SEEK MEDICAL CARE IF:  °· You feel like you want to stop breastfeeding or have become frustrated with breastfeeding. °· You have painful breasts or nipples. °· Your nipples are cracked or bleeding. °· Your breasts are red, tender, or warm. °· You have   a swollen area on either breast. °· You have a fever or chills. °· You have nausea or vomiting. °· You have drainage other than breast milk from your nipples. °· Your breasts do not become full before feedings by the fifth day after you give birth. °· You feel sad and depressed. °· Your baby is too sleepy to eat  well. °· Your baby is having trouble sleeping.   °· Your baby is wetting less than 3 diapers in a 24-hour period. °· Your baby has less than 3 stools in a 24-hour period. °· Your baby's skin or the white part of his or her eyes becomes yellow.   °· Your baby is not gaining weight by 5 days of age. °SEEK IMMEDIATE MEDICAL CARE IF:  °· Your baby is overly tired (lethargic) and does not want to wake up and feed. °· Your baby develops an unexplained fever. °Document Released: 06/29/2005 Document Revised: 07/04/2013 Document Reviewed: 12/21/2012 °ExitCare® Patient Information ©2015 [employer]. This information is not intended to replace advice given to you by your health care provider. Make sure you discuss any questions you have with your health care provider. ° °

## 2018-12-23 DIAGNOSIS — Z789 Other specified health status: Secondary | ICD-10-CM | POA: Insufficient documentation

## 2018-12-23 DIAGNOSIS — Z803 Family history of malignant neoplasm of breast: Secondary | ICD-10-CM | POA: Insufficient documentation

## 2019-04-07 DIAGNOSIS — L819 Disorder of pigmentation, unspecified: Secondary | ICD-10-CM | POA: Insufficient documentation

## 2019-06-02 ENCOUNTER — Encounter: Payer: Self-pay | Admitting: Obstetrics & Gynecology

## 2021-06-17 ENCOUNTER — Ambulatory Visit (INDEPENDENT_AMBULATORY_CARE_PROVIDER_SITE_OTHER): Payer: BLUE CROSS/BLUE SHIELD | Admitting: Nurse Practitioner

## 2021-06-17 ENCOUNTER — Encounter (HOSPITAL_BASED_OUTPATIENT_CLINIC_OR_DEPARTMENT_OTHER): Payer: Self-pay

## 2021-06-17 DIAGNOSIS — Z91199 Patient's noncompliance with other medical treatment and regimen due to unspecified reason: Secondary | ICD-10-CM

## 2021-06-17 NOTE — Progress Notes (Signed)
Patient not seen.

## 2021-07-03 ENCOUNTER — Encounter (HOSPITAL_BASED_OUTPATIENT_CLINIC_OR_DEPARTMENT_OTHER): Payer: Self-pay | Admitting: Nurse Practitioner

## 2021-12-18 ENCOUNTER — Emergency Department (HOSPITAL_BASED_OUTPATIENT_CLINIC_OR_DEPARTMENT_OTHER)
Admission: EM | Admit: 2021-12-18 | Discharge: 2021-12-18 | Disposition: A | Discharge status: Home / Self Care | Payer: BC Managed Care – PPO | Attending: Emergency Medicine | Admitting: Emergency Medicine

## 2021-12-18 ENCOUNTER — Other Ambulatory Visit: Payer: Self-pay

## 2021-12-18 DIAGNOSIS — T24211A Burn of second degree of right thigh, initial encounter: Secondary | ICD-10-CM | POA: Insufficient documentation

## 2021-12-18 DIAGNOSIS — T300 Burn of unspecified body region, unspecified degree: Secondary | ICD-10-CM

## 2021-12-18 DIAGNOSIS — X12XXXA Contact with other hot fluids, initial encounter: Secondary | ICD-10-CM | POA: Diagnosis not present

## 2021-12-18 DIAGNOSIS — T24212A Burn of second degree of left thigh, initial encounter: Secondary | ICD-10-CM | POA: Diagnosis not present

## 2021-12-18 DIAGNOSIS — Y9289 Other specified places as the place of occurrence of the external cause: Secondary | ICD-10-CM | POA: Diagnosis not present

## 2021-12-18 MED ORDER — SILVER SULFADIAZINE 1 % EX CREA
TOPICAL_CREAM | Freq: Once | CUTANEOUS | Status: DC
  Filled 2021-12-18: qty 85
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
## 2021-12-18 NOTE — Discharge Instructions (Addendum)
Recommend applying topical antibiotic cream such as bacitracin or neomycin and gauze dressing.  Keep clean.  If you develop worsening redness, drainage, fever, or other new concerning symptom, come back to ER for reassessment.  For follow-up, may follow-up with your primary care doctor or with a plastics specialist if they are available to see you.

## 2021-12-18 NOTE — ED Notes (Signed)
Dc instructions reviewed with patient. Patient voiced understanding. Dc with belongings.  °

## 2022-11-26 DIAGNOSIS — R519 Headache, unspecified: Secondary | ICD-10-CM | POA: Diagnosis not present

## 2022-11-26 DIAGNOSIS — J020 Streptococcal pharyngitis: Secondary | ICD-10-CM | POA: Diagnosis not present

## 2022-11-26 DIAGNOSIS — J029 Acute pharyngitis, unspecified: Secondary | ICD-10-CM | POA: Diagnosis not present

## 2022-11-26 DIAGNOSIS — Z20822 Contact with and (suspected) exposure to covid-19: Secondary | ICD-10-CM | POA: Diagnosis not present

## 2023-04-19 DIAGNOSIS — L309 Dermatitis, unspecified: Secondary | ICD-10-CM | POA: Diagnosis not present

## 2023-11-08 DIAGNOSIS — R92333 Mammographic heterogeneous density, bilateral breasts: Secondary | ICD-10-CM | POA: Diagnosis not present

## 2023-11-08 DIAGNOSIS — Z1231 Encounter for screening mammogram for malignant neoplasm of breast: Secondary | ICD-10-CM | POA: Diagnosis not present

## 2023-11-19 DIAGNOSIS — R21 Rash and other nonspecific skin eruption: Secondary | ICD-10-CM | POA: Diagnosis not present

## 2023-11-19 DIAGNOSIS — B354 Tinea corporis: Secondary | ICD-10-CM | POA: Diagnosis not present

## 2023-12-22 DIAGNOSIS — R928 Other abnormal and inconclusive findings on diagnostic imaging of breast: Secondary | ICD-10-CM | POA: Diagnosis not present

## 2024-02-28 ENCOUNTER — Ambulatory Visit: Admitting: Family Medicine

## 2024-05-29 ENCOUNTER — Ambulatory Visit: Admitting: Family Medicine

## 2024-05-29 ENCOUNTER — Encounter: Payer: Self-pay | Admitting: Family Medicine

## 2024-05-29 VITALS — BP 114/66 | HR 72 | Temp 97.7°F | Ht 62.0 in | Wt 109.5 lb

## 2024-05-29 DIAGNOSIS — L308 Other specified dermatitis: Secondary | ICD-10-CM

## 2024-05-29 MED ORDER — FLUTICASONE PROPIONATE 0.05 % EX CREA: TOPICAL_CREAM | Freq: Two times a day (BID) | CUTANEOUS | 1 refills | Status: AC
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
## 2024-05-29 NOTE — Patient Instructions (Signed)
Welcome to [facility]! It was a pleasure meeting you today. ? ?As discussed, Please schedule a 2 month follow up visit today. ? ?PLEASE NOTE: ? ?If you had any LAB tests please let us know if you have not heard back within a few days. You may see your results on MyChart before we have a chance to review them but we will give you a call once they are reviewed by [place]. If we ordered any REFERRALS today, please let us know if you have not heard from their office within the next week.  ?Let us know through MyChart if you are needing REFILLS, or have your pharmacy send [place] the request. You can also use MyChart to communicate with me or any office staff. ? ?Please try these tips to maintain a healthy lifestyle: ? ?Eat most of your calories during the day when you are active. Eliminate processed foods including packaged sweets (pies, cakes, cookies), reduce intake of potatoes, white bread, white pasta, and white rice. Look for whole grain options, oat flour or almond flour. ? ?Each meal should contain half fruits/vegetables, one quarter protein, and one quarter carbs (no bigger than a computer mouse). ? ?Cut down on sweet beverages. This includes juice, soda, and sweet tea. Also watch fruit intake, though this is a healthier sweet option, it still contains natural sugar! Limit to 3 servings daily. ? ?Drink at least 1 glass of water with each meal and aim for at least 8 glasses per day ? ?Exercise at least 150 minutes every week.   ?

## 2024-06-27 ENCOUNTER — Encounter: Payer: Self-pay | Admitting: Family Medicine

## 2024-06-27 ENCOUNTER — Other Ambulatory Visit (HOSPITAL_COMMUNITY)
Admission: RE | Admit: 2024-06-27 | Discharge: 2024-06-27 | Disposition: A | Source: Ambulatory Visit | Attending: Family Medicine | Admitting: Family Medicine

## 2024-06-27 ENCOUNTER — Ambulatory Visit: Admitting: Family Medicine

## 2024-06-27 VITALS — BP 118/74 | HR 68 | Temp 97.5°F | Ht 62.0 in | Wt 107.2 lb

## 2024-06-27 DIAGNOSIS — E559 Vitamin D deficiency, unspecified: Secondary | ICD-10-CM

## 2024-06-27 DIAGNOSIS — Z124 Encounter for screening for malignant neoplasm of cervix: Secondary | ICD-10-CM

## 2024-06-27 DIAGNOSIS — Z789 Other specified health status: Secondary | ICD-10-CM

## 2024-06-27 DIAGNOSIS — Z01419 Encounter for gynecological examination (general) (routine) without abnormal findings: Secondary | ICD-10-CM

## 2024-06-27 DIAGNOSIS — Z1159 Encounter for screening for other viral diseases: Secondary | ICD-10-CM | POA: Diagnosis not present

## 2024-06-27 LAB — CBC WITH DIFFERENTIAL/PLATELET
Basophils Absolute: 0 K/uL (ref 0.0–0.1)
Basophils Relative: 0.6 % (ref 0.0–3.0)
Eosinophils Absolute: 0.1 K/uL (ref 0.0–0.7)
Eosinophils Relative: 2.4 % (ref 0.0–5.0)
HCT: 38.8 % (ref 36.0–46.0)
Hemoglobin: 13 g/dL (ref 12.0–15.0)
Lymphocytes Relative: 33.7 % (ref 12.0–46.0)
Lymphs Abs: 1.7 K/uL (ref 0.7–4.0)
MCHC: 33.6 g/dL (ref 30.0–36.0)
MCV: 85.5 fl (ref 78.0–100.0)
Monocytes Absolute: 0.3 K/uL (ref 0.1–1.0)
Monocytes Relative: 6 % (ref 3.0–12.0)
Neutro Abs: 2.9 K/uL (ref 1.4–7.7)
Neutrophils Relative %: 57.3 % (ref 43.0–77.0)
Platelets: 320 K/uL (ref 150.0–400.0)
RBC: 4.54 Mil/uL (ref 3.87–5.11)
RDW: 13.8 % (ref 11.5–15.5)
WBC: 5.1 K/uL (ref 4.0–10.5)

## 2024-06-27 LAB — COMPREHENSIVE METABOLIC PANEL WITH GFR
ALT: 12 U/L (ref 3–35)
AST: 19 U/L (ref 5–37)
Albumin: 4.6 g/dL (ref 3.5–5.2)
Alkaline Phosphatase: 46 U/L (ref 39–117)
BUN: 11 mg/dL (ref 6–23)
CO2: 25 meq/L (ref 19–32)
Calcium: 9.7 mg/dL (ref 8.4–10.5)
Chloride: 103 meq/L (ref 96–112)
Creatinine, Ser: 0.83 mg/dL (ref 0.40–1.20)
GFR: 87.09 mL/min (ref >60.00)
Glucose, Bld: 86 mg/dL (ref 70–99)
Potassium: 4.5 meq/L (ref 3.5–5.1)
Sodium: 136 meq/L (ref 135–145)
Total Bilirubin: 0.8 mg/dL (ref 0.2–1.2)
Total Protein: 7.6 g/dL (ref 6.0–8.3)

## 2024-06-27 LAB — LIPID PANEL
Cholesterol: 187 mg/dL (ref 28–200)
HDL: 73.5 mg/dL (ref >39.00)
LDL Cholesterol: 102 mg/dL — ABNORMAL HIGH (ref 10–99)
NonHDL: 113.93
Total CHOL/HDL Ratio: 3
Triglycerides: 60 mg/dL (ref 10.0–149.0)
VLDL: 12 mg/dL (ref 0.0–40.0)

## 2024-06-27 LAB — VITAMIN D 25 HYDROXY (VIT D DEFICIENCY, FRACTURES): VITD: 21.16 ng/mL — ABNORMAL LOW (ref 30.00–100.00)

## 2024-06-27 LAB — VITAMIN B12: Vitamin B-12: 352 pg/mL (ref 211–911)

## 2024-06-27 LAB — TSH: TSH: 2.14 u[IU]/mL (ref 0.35–5.50)

## 2024-06-27 LAB — HEMOGLOBIN A1C: Hgb A1c MFr Bld: 5.5 % (ref 4.6–6.5)

## 2024-06-27 NOTE — Patient Instructions (Signed)

## 2024-06-27 NOTE — Progress Notes (Signed)
 Phone [phone]   Subjective:   Patient is a 43 y.o. female presenting for annual physical.    Chief Complaint  Patient presents with   Annual Exam    Pt is here for CPE    Discussed the use of AI scribe software for clinical note transcription with the patient, who gave verbal consent to proceed.  History of Present Illness Kim Bryan is a 43 year old female who presents for an annual physical exam.  She has a history of eczema, which has improved with previous treatment. The medication provided has helped clear it up.  She experiences occasional noise in both knees when squatting, which is not consistent and not associated with pain. She is currently exercising regularly.  She mentions experiencing rare hot flashes at night, describing them as sudden episodes of feeling hot. Her menstrual periods remain regular, though sometimes early.  She follows an intermittent fasting routine, having dinner by 6:30 PM and not eating until coffee at 10:30 AM the next day. She is vegetarian and consumes a diet consisting of coffee, nuts, fruit, eggs, salad, yogurt, bread, Indian salad, and curry.  She has a family history of cancer, with her aunt having had breast cancer and her mother having had tongue cancer. She had a mammogram in June 2025, which was normal. She previously had a concern about a knot in her right armpit, which was evaluated a few years ago.  No major headaches, dizziness, chest pain, coughing, wheezing, shortness of breath, vomiting, diarrhea, trouble urinating, or depressive symptoms.    See problem oriented charting- ROS- ROS: Gen: no fever, chills  Skin: no rash, itching ENT: no ear pain, ear drainage, nasal congestion, rhinorrhea, sinus pressure, sore throat Eyes: no blurry vision, double vision Resp: no cough, wheeze,SOB CV: no CP, palpitations, LE edema,  GI: no heartburn, n/v/d/c, abd pain GU: no dysuria, urgency, frequency, hematuria MSK: no joint pain,  myalgias, back pain Neuro: no dizziness, headache, weakness, vertigo Psych: no depression, anxiety, insomnia, SI   The following were reviewed and entered/updated in epic: Past Medical History:  Diagnosis Date   Asthma no meds; sob in 2005   No pertinent past medical history    Postpartum care following vaginal delivery (4/29) 11/09/2014   Patient Active Problem List   Diagnosis Date Noted   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 30 g 1   No current facility-administered medications for this visit.    Allergies-reviewed and updated Allergies[1]  Social History   Social History Narrative   Not on file   Objective  Objective:  BP 118/74 (BP Location: Left Arm, Patient Position: Sitting)   Pulse 68   Temp (!) 97.5 F (36.4 C) (Temporal)   Ht 5' 2 (1.575 m)   Wt 107 lb 4 oz (48.6 kg)   LMP 06/05/2024 (Exact  Date)   SpO2 98%   BMI 19.62 kg/m  Physical Exam  Gen: WDWN NAD HEENT: NCAT, conjunctiva not injected, sclera nonicteric TM WNL B, OP moist, no exudates  NECK:  supple, no thyromegaly, no nodes, no carotid bruits CARDIAC: RRR, S1S2+, no murmur. DP 2+B LUNGS: CTAB. No wheezes ABDOMEN:  BS+, soft, NTND, No HSM, no masses EXT:  no edema MSK: no gross abnormalities. MS 5/5 all 4 NEURO: A&O x3.  CN II-XII intact.  PSYCH: normal mood. Good eye contact   Breasts: Examined lying and  sitting.              Right:   Without masses but FC, retractions,  nipple discharge or axillary adenopathy. + breast tissue R axilla               Left:     Without masses but FC, retractions, nipple discharge or axillary adenopathy. Genitourinary              Inguinal/mons:  Normal without inguinal adenopathy             External genitalia:  Normal appearing vulva with no masses, tenderness, or lesions             BUS/Urethra/Skene's glands:  Normal             Vagina:  Normal appearing with normal color and discharge, no lesions             Cervix:  Normal appearing without discharge or lesions             Uterus:  Normal in size, shape and contour.  Midline and mobile, nontender             Adnexa/parametria:                           Rt:        Normal in size, without masses or tenderness.                         Lt:        Normal in size, without masses or tenderness.             Anus and perineum: Normal  Chaperone present SK    Assessment and Plan   Health Maintenance counseling: 1. Anticipatory guidance: Patient counseled regarding regular dental exams q6 months, eye exams,  avoiding smoking and second hand smoke, limiting alcohol to 1 beverage per day, no illicit drugs.   2. Risk factor reduction:  Advised patient of need for regular exercise and diet rich and fruits and vegetables to reduce risk of heart attack and stroke. Exercise- +.  Wt Readings from Last 3 Encounters:  06/27/24 107 lb 4 oz (48.6 kg)  05/29/24 109 lb 8 oz (49.7 kg)  11/09/14 142 lb (64.4 kg)   3. Immunizations/screenings/ancillary studies Immunization History  Administered Date(s) Administered   Tdap 07/13/2014   Health Maintenance Due  Topic Date Due   Hepatitis C Screening  Never done   Hepatitis B Vaccines 19-59 Average Risk (1 of 3 - 19+ 3-dose series) Never done   HPV VACCINES (1 - 3-dose SCDM series) Never done   Cervical Cancer Screening (HPV/Pap Cotest)  09/02/2016    4. Cervical cancer  screening- done today 5. Breast cancer screening-  mammogram pt to sch for June 6. Colon cancer screening - n/a 7. Skin cancer screening- advised  regular sunscreen use. Denies worrisome, changing, or new skin lesions.  8. Birth control/STD check- condoms 9. Osteoporosis screening- n/a 10. Smoking associated screening - non smoker  Well woman exam with routine gynecological exam -     Lipid panel -     Comprehensive metabolic panel with GFR -     CBC with Differential/Platelet -     Hemoglobin A1c -     TSH -     Cytology - PAP -     Hepatitis C antibody -     HIV Antibody (routine testing w rflx) -     Vitamin B12 -     VITAMIN D  25 Hydroxy (Vit-D Deficiency, Fractures)  Screening for cervical cancer -     Cytology - PAP  Screening for viral disease -     Hepatitis C antibody -     HIV Antibody (routine testing w rflx)  Vegetarian diet -     Vitamin B12  Vitamin D  deficiency -     VITAMIN D  25 Hydroxy (Vit-D Deficiency, Fractures)    Assessment and Plan Assessment & Plan Woman's Wellness Visit   Routine wellness visit revealed no new concerns. She experiences regular menstrual cycles with occasional early periods. There is a family history of breast and tongue cancer. Her last mammogram in June was normal, and mobile mammograms are reliable and convenient. Continue regular mammograms, with the next due in October or before. Routine blood work, including cholesterol and vitamin D  levels, was performed.  Eczema   Her eczema shows improvement with current treatment.  Vitamin D  deficiency   Vitamin D  levels fluctuate but remain generally well-managed.  Perimenopausal symptoms   She experiences occasional hot flashes and night sweats, possibly related to perimenopause. Regular menstrual cycles suggest an early perimenopausal phase. A vegetarian diet and intermittent fasting may influence night sweats. Track dietary patterns and night sweats to identify potential triggers.  Consider dietary modifications if a pattern is identified.     Recommended follow up: Return in about 1 year (around 06/27/2025) for annual physical.  Lab/Order associations:+ fasting  Jenkins CHRISTELLA Carrel, MD      [1] No Known Allergies

## 2024-06-28 LAB — HIV ANTIBODY (ROUTINE TESTING W REFLEX)
HIV 1&2 Ab, 4th Generation: NONREACTIVE
HIV FINAL INTERPRETATION: NEGATIVE

## 2024-06-28 LAB — HEPATITIS C ANTIBODY: Hepatitis C Ab: NONREACTIVE

## 2024-06-29 ENCOUNTER — Ambulatory Visit: Payer: Self-pay | Admitting: Family Medicine

## 2024-06-29 LAB — CYTOLOGY - PAP
Comment: NEGATIVE
Diagnosis: NEGATIVE
High risk HPV: NEGATIVE

## 2024-06-29 NOTE — Progress Notes (Signed)
 Labs and pap are great except Vitamin D  a little low-take 2000iu/day otc

## 2024-06-30 NOTE — Progress Notes (Signed)
Called pt - left message for return call.

## 2025-06-29 ENCOUNTER — Encounter: Admitting: Family Medicine
